# Patient Record
Sex: Female | Born: 1970
Health system: Southern US, Community
[De-identification: ages and names within clinical notes are randomized; demographics above are authoritative.]

## PROBLEM LIST (undated history)

## (undated) DIAGNOSIS — T7840XA Allergy, unspecified, initial encounter: Secondary | ICD-10-CM

## (undated) DIAGNOSIS — M1990 Unspecified osteoarthritis, unspecified site: Secondary | ICD-10-CM

## (undated) DIAGNOSIS — J45909 Unspecified asthma, uncomplicated: Secondary | ICD-10-CM

## (undated) HISTORY — DX: Allergy, unspecified, initial encounter: T78.40XA

## (undated) HISTORY — DX: Unspecified asthma, uncomplicated: J45.909

## (undated) HISTORY — DX: Unspecified osteoarthritis, unspecified site: M19.90

---
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
## 2003-07-03 ENCOUNTER — Other Ambulatory Visit: Admission: RE | Admit: 2003-07-03 | Discharge: 2003-07-03 | Payer: Self-pay | Admitting: Obstetrics & Gynecology

## 2005-02-03 ENCOUNTER — Other Ambulatory Visit: Admission: RE | Admit: 2005-02-03 | Discharge: 2005-02-03 | Payer: Self-pay | Admitting: Obstetrics and Gynecology

## 2005-05-09 ENCOUNTER — Encounter: Admission: RE | Admit: 2005-05-09 | Discharge: 2005-05-09 | Payer: Self-pay | Admitting: Obstetrics and Gynecology

## 2005-12-04 ENCOUNTER — Encounter: Admission: RE | Admit: 2005-12-04 | Discharge: 2005-12-04 | Payer: Self-pay | Admitting: Obstetrics and Gynecology

## 2007-07-14 LAB — CONVERTED CEMR LAB: Pap Smear: NORMAL

## 2009-03-12 ENCOUNTER — Ambulatory Visit: Payer: Self-pay | Admitting: Internal Medicine

## 2009-03-12 DIAGNOSIS — Z9189 Other specified personal risk factors, not elsewhere classified: Secondary | ICD-10-CM | POA: Insufficient documentation

## 2009-03-12 DIAGNOSIS — Z9889 Other specified postprocedural states: Secondary | ICD-10-CM | POA: Insufficient documentation

## 2009-03-12 DIAGNOSIS — J45909 Unspecified asthma, uncomplicated: Secondary | ICD-10-CM

## 2009-03-18 ENCOUNTER — Ambulatory Visit: Payer: Self-pay | Admitting: Internal Medicine

## 2009-03-19 LAB — CONVERTED CEMR LAB
ALT: 14 units/L (ref 0–35)
AST: 18 units/L (ref 0–37)
Albumin: 4.2 g/dL (ref 3.5–5.2)
Alkaline Phosphatase: 35 units/L — ABNORMAL LOW (ref 39–117)
BUN: 14 mg/dL (ref 6–23)
Basophils Absolute: 0.1 10*3/uL (ref 0.0–0.1)
Basophils Relative: 1 % (ref 0.0–3.0)
Bilirubin Urine: NEGATIVE
Bilirubin, Direct: 0.3 mg/dL (ref 0.0–0.3)
CO2: 25 meq/L (ref 19–32)
Calcium: 9.4 mg/dL (ref 8.4–10.5)
Chloride: 107 meq/L (ref 96–112)
Cholesterol: 191 mg/dL (ref 0–200)
Creatinine, Ser: 0.9 mg/dL (ref 0.4–1.2)
Eosinophils Absolute: 0.3 10*3/uL (ref 0.0–0.7)
Eosinophils Relative: 4 % (ref 0.0–5.0)
GFR calc non Af Amer: 73.89 mL/min (ref >60)
Glucose, Bld: 91 mg/dL (ref 70–99)
HCT: 43.7 % (ref 36.0–46.0)
HDL: 46.3 mg/dL (ref >39.00)
Hemoglobin, Urine: NEGATIVE
Hemoglobin: 14.8 g/dL (ref 12.0–15.0)
Ketones, ur: NEGATIVE mg/dL
LDL Cholesterol: 134 mg/dL — ABNORMAL HIGH (ref 0–99)
Leukocytes, UA: NEGATIVE
Lymphocytes Relative: 27.8 % (ref 12.0–46.0)
Lymphs Abs: 1.8 10*3/uL (ref 0.7–4.0)
MCHC: 33.9 g/dL (ref 30.0–36.0)
MCV: 95.3 fL (ref 78.0–100.0)
Monocytes Absolute: 0.5 10*3/uL (ref 0.1–1.0)
Monocytes Relative: 7.7 % (ref 3.0–12.0)
Neutro Abs: 3.6 10*3/uL (ref 1.4–7.7)
Neutrophils Relative %: 59.5 % (ref 43.0–77.0)
Nitrite: NEGATIVE
Platelets: 269 10*3/uL (ref 150.0–400.0)
Potassium: 4.7 meq/L (ref 3.5–5.1)
RBC: 4.58 M/uL (ref 3.87–5.11)
RDW: 12.3 % (ref 11.5–14.6)
Sodium: 137 meq/L (ref 135–145)
Specific Gravity, Urine: 1.02 (ref 1.000–1.030)
TSH: 1.92 microintl units/mL (ref 0.35–5.50)
Total Bilirubin: 2.3 mg/dL — ABNORMAL HIGH (ref 0.3–1.2)
Total CHOL/HDL Ratio: 4
Total Protein, Urine: NEGATIVE mg/dL
Total Protein: 7.6 g/dL (ref 6.0–8.3)
Triglycerides: 54 mg/dL (ref 0.0–149.0)
Urine Glucose: NEGATIVE mg/dL
Urobilinogen, UA: 0.2 (ref 0.0–1.0)
VLDL: 10.8 mg/dL (ref 0.0–40.0)
WBC: 6.3 10*3/uL (ref 4.5–10.5)
pH: 5.5 (ref 5.0–8.0)

## 2010-05-13 ENCOUNTER — Telehealth: Payer: Self-pay | Admitting: Internal Medicine

## 2010-07-02 ENCOUNTER — Encounter: Payer: Self-pay | Admitting: Obstetrics and Gynecology

## 2010-07-12 NOTE — Progress Notes (Signed)
Summary: cough symptoms  Phone Note Call from Patient   Caller: Patient Call For: Newt Lukes MD Reason for Call: Acute Illness Summary of Call: dry cough x 3 weeks, no fever, SOB, wheeze or sputum - dayquil  and tussionex ineffective at resolution of symptoms  - rec H2B, mucinex and tessalon two times a day x 10days, then each as needed - pt agrees to call if symptoms worse or new fever or probs with med Initial call taken by: Newt Lukes MD,  May 13, 2010 10:28 AM    New/Updated Medications: PEPCID 20 MG TABS (FAMOTIDINE) 1 by mouth two times a day MUCINEX DM 30-600 MG XR12H-TAB (DEXTROMETHORPHAN-GUAIFENESIN) 1 by mouth two times a day TESSALON PERLES 100 MG CAPS (BENZONATATE) 1-2 by mouth two times a day x 10days, then as needed for cough symptoms Prescriptions: TESSALON PERLES 100 MG CAPS (BENZONATATE) 1-2 by mouth two times a day x 10days, then as needed for cough symptoms  #60 x 0   Entered and Authorized by:   Newt Lukes MD   Signed by:   Newt Lukes MD on 05/13/2010   Method used:   Electronically to        [facility] # [phone]* (retail)       [address]       [place], [place]  [address]       Ph: [phone]       Fax: [phone]   RxID:   [id number]

## 2011-10-11 HISTORY — PX: KNEE ARTHROSCOPY W/ ACL RECONSTRUCTION: SHX1858

## 2012-02-29 ENCOUNTER — Other Ambulatory Visit: Payer: Self-pay | Admitting: Internal Medicine

## 2012-02-29 DIAGNOSIS — Z0000 Encounter for general adult medical examination without abnormal findings: Secondary | ICD-10-CM

## 2012-03-01 ENCOUNTER — Other Ambulatory Visit (INDEPENDENT_AMBULATORY_CARE_PROVIDER_SITE_OTHER): Payer: Self-pay

## 2012-03-01 DIAGNOSIS — Z0000 Encounter for general adult medical examination without abnormal findings: Secondary | ICD-10-CM

## 2012-03-01 LAB — URINALYSIS, ROUTINE W REFLEX MICROSCOPIC
Bilirubin Urine: NEGATIVE
Nitrite: NEGATIVE
Specific Gravity, Urine: 1.025 (ref 1.000–1.030)
Total Protein, Urine: NEGATIVE
Urine Glucose: NEGATIVE
Urobilinogen, UA: 0.2 (ref 0.0–1.0)
pH: 5.5 (ref 5.0–8.0)

## 2012-03-01 LAB — CBC WITH DIFFERENTIAL/PLATELET
Basophils Relative: 1.1 % (ref 0.0–3.0)
Eosinophils Absolute: 0.3 10*3/uL (ref 0.0–0.7)
Eosinophils Relative: 5 % (ref 0.0–5.0)
HCT: 40.1 % (ref 36.0–46.0)
Hemoglobin: 13.6 g/dL (ref 12.0–15.0)
Lymphocytes Relative: 31.4 % (ref 12.0–46.0)
Lymphs Abs: 1.9 10*3/uL (ref 0.7–4.0)
MCHC: 33.8 g/dL (ref 30.0–36.0)
MCV: 94.5 fl (ref 78.0–100.0)
Monocytes Absolute: 0.5 10*3/uL (ref 0.1–1.0)
Monocytes Relative: 8.2 % (ref 3.0–12.0)
Neutro Abs: 3.2 10*3/uL (ref 1.4–7.7)
Neutrophils Relative %: 54.3 % (ref 43.0–77.0)
Platelets: 241 10*3/uL (ref 150.0–400.0)
RBC: 4.24 Mil/uL (ref 3.87–5.11)
RDW: 13.6 % (ref 11.5–14.6)
WBC: 6 10*3/uL (ref 4.5–10.5)

## 2012-03-01 LAB — HEPATIC FUNCTION PANEL
ALT: 17 U/L (ref 0–35)
Albumin: 3.9 g/dL (ref 3.5–5.2)
Bilirubin, Direct: 0.2 mg/dL (ref 0.0–0.3)
Total Bilirubin: 1.9 mg/dL — ABNORMAL HIGH (ref 0.3–1.2)
Total Protein: 6.7 g/dL (ref 6.0–8.3)

## 2012-03-01 LAB — BASIC METABOLIC PANEL
Calcium: 9.1 mg/dL (ref 8.4–10.5)
Chloride: 108 mEq/L (ref 96–112)
Creatinine, Ser: 1 mg/dL (ref 0.4–1.2)
GFR: 68.41 mL/min (ref >60.00)
Potassium: 4.8 mEq/L (ref 3.5–5.1)
Sodium: 140 mEq/L (ref 135–145)

## 2012-03-01 LAB — LIPID PANEL
Cholesterol: 161 mg/dL (ref 0–200)
HDL: 45.3 mg/dL (ref >39.00)
LDL Cholesterol: 104 mg/dL — ABNORMAL HIGH (ref 0–99)
Total CHOL/HDL Ratio: 4
Triglycerides: 58 mg/dL (ref 0.0–149.0)
VLDL: 11.6 mg/dL (ref 0.0–40.0)

## 2012-03-01 LAB — TSH: TSH: 1.82 u[IU]/mL (ref 0.35–5.50)

## 2012-03-06 ENCOUNTER — Ambulatory Visit (INDEPENDENT_AMBULATORY_CARE_PROVIDER_SITE_OTHER): Payer: 59 | Admitting: Internal Medicine

## 2012-03-06 ENCOUNTER — Encounter: Payer: Self-pay | Admitting: Internal Medicine

## 2012-03-06 VITALS — BP 110/80 | HR 70 | Temp 97.0°F | Resp 16 | Wt 164.0 lb

## 2012-03-06 DIAGNOSIS — Z0000 Encounter for general adult medical examination without abnormal findings: Secondary | ICD-10-CM

## 2012-03-06 DIAGNOSIS — Z1239 Encounter for other screening for malignant neoplasm of breast: Secondary | ICD-10-CM

## 2012-03-06 NOTE — Patient Instructions (Signed)
It was good to see you today. We have reviewed your prior records including labs and tests today Health Maintenance reviewed - all recommended immunizations and age-appropriate screenings are up-to-date. we'll make referral to mammogram . Our office will contact you regarding appointment(s) once made. Please schedule followup in 1-2 years, call sooner if problems.  Health Maintenance, Females A healthy lifestyle and preventative care can promote health and wellness.  Maintain regular health, dental, and eye exams.   Eat a healthy diet. Foods like vegetables, fruits, whole grains, low-fat dairy products, and lean protein foods contain the nutrients you need without too many calories. Decrease your intake of foods high in solid fats, added sugars, and salt. Get information about a proper diet from your caregiver, if necessary.   Regular physical exercise is one of the most important things you can do for your health. Most adults should get at least 150 minutes of moderate-intensity exercise (any activity that increases your heart rate and causes you to sweat) each week. In addition, most adults need muscle-strengthening exercises on 2 or more days a week.     Maintain a healthy weight. The body mass index (BMI) is a screening tool to identify possible weight problems. It provides an estimate of body fat based on height and weight. Your caregiver can help determine your BMI, and can help you achieve or maintain a healthy weight. For adults 20 years and older:   A BMI below 18.5 is considered underweight.   A BMI of 18.5 to 24.9 is normal.   A BMI of 25 to 29.9 is considered overweight.   A BMI of 30 and above is considered obese.   Maintain normal blood lipids and cholesterol by exercising and minimizing your intake of saturated fat. Eat a balanced diet with plenty of fruits and vegetables. Blood tests for lipids and cholesterol should begin at age 32 and be repeated every 5 years. If your  lipid or cholesterol levels are high, you are over 50, or you are a high risk for heart disease, you may need your cholesterol levels checked more frequently. Ongoing high lipid and cholesterol levels should be treated with medicines if diet and exercise are not effective.   If you smoke, find out from your caregiver how to quit. If you do not use tobacco, do not start.   If you are pregnant, do not drink alcohol. If you are breastfeeding, be very cautious about drinking alcohol. If you are not pregnant and choose to drink alcohol, do not exceed 1 drink per day. One drink is considered to be 12 ounces (355 mL) of beer, 5 ounces (148 mL) of wine, or 1.5 ounces (44 mL) of liquor.   Avoid use of street drugs. Do not share needles with anyone. Ask for help if you need support or instructions about stopping the use of drugs.   High blood pressure causes heart disease and increases the risk of stroke. Blood pressure should be checked at least every 1 to 2 years. Ongoing high blood pressure should be treated with medicines, if weight loss and exercise are not effective.   If you are 28 to 42 years old, ask your caregiver if you should take aspirin to prevent strokes.   Diabetes screening involves taking a blood sample to check your fasting blood sugar level. This should be done once every 3 years, after age 38, if you are within normal weight and without risk factors for diabetes. Testing should be considered at a younger  age or be carried out more frequently if you are overweight and have at least 1 risk factor for diabetes.   Breast cancer screening is essential preventative care for women. You should practice "breast self-awareness." This means understanding the normal appearance and feel of your breasts and may include breast self-examination. Any changes detected, no matter how small, should be reported to a caregiver. Women in their 19s and 30s should have a clinical breast exam (CBE) by a caregiver as  part of a regular health exam every 1 to 3 years. After age 43, women should have a CBE every year. Starting at age 108, women should consider having a mammogram (breast X-ray) every year. Women who have a family history of breast cancer should talk to their caregiver about genetic screening. Women at a high risk of breast cancer should talk to their caregiver about having an MRI and a mammogram every year.   The Pap test is a screening test for cervical cancer. Women should have a Pap test starting at age 35. Between ages 89 and 96, Pap tests should be repeated every 2 years. Beginning at age 7, you should have a Pap test every 3 years as long as the past 3 Pap tests have been normal. If you had a hysterectomy for a problem that was not cancer or a condition that could lead to cancer, then you no longer need Pap tests. If you are between ages 11 and 72, and you have had normal Pap tests going back 10 years, you no longer need Pap tests. If you have had past treatment for cervical cancer or a condition that could lead to cancer, you need Pap tests and screening for cancer for at least 20 years after your treatment. If Pap tests have been discontinued, risk factors (such as a new sexual partner) need to be reassessed to determine if screening should be resumed. Some women have medical problems that increase the chance of getting cervical cancer. In these cases, your caregiver may recommend more frequent screening and Pap tests.   The human papillomavirus (HPV) test is an additional test that may be used for cervical cancer screening. The HPV test looks for the virus that can cause the cell changes on the cervix. The cells collected during the Pap test can be tested for HPV. The HPV test could be used to screen women aged 61 years and older, and should be used in women of any age who have unclear Pap test results. After the age of 75, women should have HPV testing at the same frequency as a Pap test.    Colorectal cancer can be detected and often prevented. Most routine colorectal cancer screening begins at the age of 61 and continues through age 45. However, your caregiver may recommend screening at an earlier age if you have risk factors for colon cancer. On a yearly basis, your caregiver may provide home test kits to check for hidden blood in the stool. Use of a small camera at the end of a tube, to directly examine the colon (sigmoidoscopy or colonoscopy), can detect the earliest forms of colorectal cancer. Talk to your caregiver about this at age 38, when routine screening begins. Direct examination of the colon should be repeated every 5 to 10 years through age 95, unless early forms of pre-cancerous polyps or small growths are found.   Hepatitis C blood testing is recommended for all people born from [birth year] through 1966 and any individual with known risks for  hepatitis C.   Practice safe sex. Use condoms and avoid high-risk sexual practices to reduce the spread of sexually transmitted infections (STIs). Sexually active women aged 36 and younger should be checked for Chlamydia, which is a common sexually transmitted infection. Older women with new or multiple partners should also be tested for Chlamydia. Testing for other STIs is recommended if you are sexually active and at increased risk.   Osteoporosis is a disease in which the bones lose minerals and strength with aging. This can result in serious bone fractures. The risk of osteoporosis can be identified using a bone density scan. Women ages 85 and over and women at risk for fractures or osteoporosis should discuss screening with their caregivers. Ask your caregiver whether you should be taking a calcium supplement or vitamin D to reduce the rate of osteoporosis.   Menopause can be associated with physical symptoms and risks. Hormone replacement therapy is available to decrease symptoms and risks. You should talk to your caregiver about whether  hormone replacement therapy is right for you.   Use sunscreen with a sun protection factor (SPF) of 30 or greater. Apply sunscreen liberally and repeatedly throughout the day. You should seek shade when your shadow is shorter than you. Protect yourself by wearing long sleeves, pants, a wide-brimmed hat, and sunglasses year round, whenever you are outdoors.   Notify your caregiver of new moles or changes in moles, especially if there is a change in shape or color. Also notify your caregiver if a mole is larger than the size of a pencil eraser.   Stay current with your immunizations.  Document Released: 12/12/2010 Document Revised: 05/18/2011 Document Reviewed: 12/12/2010 [facility] Patient Information 2012 [place], [place].

## 2012-03-06 NOTE — Progress Notes (Signed)
  Subjective:    Patient ID: Kristina Booth, female    DOB: Dec 26, 1970, 41 y.o.   MRN: [id number]  HPI patient is here today for annual physical. Patient feels well and has no complaints.  Past Medical History  Diagnosis Date  . Childhood asthma    Family History  Problem Relation Age of Onset  . Bipolar disorder Father   . Hypertension Mother   . Diabetes type II Father   . Vasculitis Sister 29    NOS   History  Substance Use Topics  . Smoking status: Never Smoker   . Smokeless tobacco: Not on file   Comment: Lives with domestic partner and 2 children  . Alcohol Use: Yes    Review of Systems Constitutional: Negative for fever or weight change.  Respiratory: Negative for cough and shortness of breath.   Cardiovascular: Negative for chest pain or palpitations.  Gastrointestinal: Negative for abdominal pain, no bowel changes.  Musculoskeletal: Negative for gait problem or joint swelling.  Skin: Negative for rash.  Neurological: Negative for dizziness or headache.  No other specific complaints in a complete review of systems (except as listed in HPI above).     Objective:   Physical Exam BP 110/80  Pulse 70  Temp 97 F (36.1 C) (Oral)  Resp 16  Wt 164 lb (74.39 kg)  SpO2 97%  LMP 02/02/2012 Wt Readings from Last 3 Encounters:  03/06/12 164 lb (74.39 kg)  03/12/09 168 lb (76.204 kg)   Constitutional: She appears well-developed and well-nourished. No distress.  HENT: Head: Normocephalic and atraumatic. Ears: B TMs ok, no erythema or effusion; Nose: Nose normal.  Mouth/Throat: Oropharynx is clear and moist. No oropharyngeal exudate.  Eyes: Conjunctivae and EOM are normal. Pupils are equal, round, and reactive to light. No scleral icterus.  Neck: Normal range of motion. Neck supple. No JVD present. No thyromegaly present.  Cardiovascular: Normal rate, regular rhythm and normal heart sounds.  No murmur heard. No BLE edema. Pulmonary/Chest: Effort normal and breath  sounds normal. No respiratory distress. She has no wheezes.  Abdominal: Soft. Bowel sounds are normal. She exhibits no distension. There is no tenderness. no masses Musculoskeletal: Normal range of motion, no joint effusions. No gross deformities Neurological: She is alert and oriented to person, place, and time. No cranial nerve deficit. Coordination normal.  Skin: Skin is warm and dry. No rash noted. No erythema.  Psychiatric: She has a normal mood and affect. Her behavior is normal. Judgment and thought content normal.   Lab Results  Component Value Date   WBC 6.0 03/01/2012   HGB 13.6 03/01/2012   HCT 40.1 03/01/2012   PLT 241.0 03/01/2012   GLUCOSE 91 03/01/2012   CHOL 161 03/01/2012   TRIG 58.0 03/01/2012   HDL 45.30 03/01/2012   LDLCALC 104* 03/01/2012   ALT 17 03/01/2012   AST 20 03/01/2012   NA 140 03/01/2012   K 4.8 03/01/2012   CL 108 03/01/2012   CREATININE 1.0 03/01/2012   BUN 15 03/01/2012   CO2 26 03/01/2012   TSH 1.82 03/01/2012       Assessment & Plan:  CPX/v70.0 - Patient has been counseled on age-appropriate routine health concerns for screening and prevention. These are reviewed and up-to-date. Immunizations are up-to-date or declined. Labs and ECG reviewed.

## 2012-04-10 ENCOUNTER — Ambulatory Visit
Admission: RE | Admit: 2012-04-10 | Discharge: 2012-04-10 | Disposition: A | Discharge status: Home / Self Care | Payer: 59 | Source: Ambulatory Visit | Attending: Internal Medicine | Admitting: Internal Medicine

## 2012-04-10 DIAGNOSIS — Z1239 Encounter for other screening for malignant neoplasm of breast: Secondary | ICD-10-CM

## 2012-04-12 ENCOUNTER — Other Ambulatory Visit: Payer: Self-pay | Admitting: Internal Medicine

## 2012-04-12 DIAGNOSIS — R928 Other abnormal and inconclusive findings on diagnostic imaging of breast: Secondary | ICD-10-CM

## 2012-04-19 ENCOUNTER — Other Ambulatory Visit: Payer: Self-pay | Admitting: Internal Medicine

## 2012-04-19 ENCOUNTER — Ambulatory Visit
Admission: RE | Admit: 2012-04-19 | Discharge: 2012-04-19 | Disposition: A | Discharge status: Home / Self Care | Payer: 59 | Source: Ambulatory Visit | Attending: Internal Medicine | Admitting: Internal Medicine

## 2012-04-19 DIAGNOSIS — R928 Other abnormal and inconclusive findings on diagnostic imaging of breast: Secondary | ICD-10-CM

## 2012-05-03 ENCOUNTER — Other Ambulatory Visit: Payer: Self-pay | Admitting: Internal Medicine

## 2012-05-03 ENCOUNTER — Ambulatory Visit
Admission: RE | Admit: 2012-05-03 | Discharge: 2012-05-03 | Disposition: A | Discharge status: Home / Self Care | Payer: 59 | Source: Ambulatory Visit | Attending: Internal Medicine | Admitting: Internal Medicine

## 2012-05-03 DIAGNOSIS — R928 Other abnormal and inconclusive findings on diagnostic imaging of breast: Secondary | ICD-10-CM

## 2013-06-12 HISTORY — PX: BREAST BIOPSY: SHX20

## 2013-06-27 ENCOUNTER — Other Ambulatory Visit: Payer: Self-pay | Admitting: Obstetrics and Gynecology

## 2015-06-08 ENCOUNTER — Ambulatory Visit (INDEPENDENT_AMBULATORY_CARE_PROVIDER_SITE_OTHER): Payer: 59 | Admitting: Internal Medicine

## 2015-06-08 ENCOUNTER — Encounter: Payer: Self-pay | Admitting: Internal Medicine

## 2015-06-08 ENCOUNTER — Other Ambulatory Visit (INDEPENDENT_AMBULATORY_CARE_PROVIDER_SITE_OTHER): Payer: 59

## 2015-06-08 VITALS — BP 108/78 | HR 70 | Temp 98.0°F | Resp 16 | Ht 68.0 in | Wt 174.0 lb

## 2015-06-08 DIAGNOSIS — Z0000 Encounter for general adult medical examination without abnormal findings: Secondary | ICD-10-CM

## 2015-06-08 LAB — CBC WITH DIFFERENTIAL/PLATELET
BASOS PCT: 1.2 % (ref 0.0–3.0)
Basophils Absolute: 0.1 10*3/uL (ref 0.0–0.1)
Eosinophils Absolute: 0.3 10*3/uL (ref 0.0–0.7)
Eosinophils Relative: 3.9 % (ref 0.0–5.0)
HCT: 43.6 % (ref 36.0–46.0)
Hemoglobin: 14.7 g/dL (ref 12.0–15.0)
LYMPHS ABS: 1.9 10*3/uL (ref 0.7–4.0)
Lymphocytes Relative: 26.4 % (ref 12.0–46.0)
MCHC: 33.7 g/dL (ref 30.0–36.0)
MCV: 93.4 fl (ref 78.0–100.0)
MONO ABS: 0.5 10*3/uL (ref 0.1–1.0)
Monocytes Relative: 7.5 % (ref 3.0–12.0)
NEUTROS PCT: 61 % (ref 43.0–77.0)
Neutro Abs: 4.5 10*3/uL (ref 1.4–7.7)
Platelets: 320 10*3/uL (ref 150.0–400.0)
RBC: 4.66 Mil/uL (ref 3.87–5.11)
RDW: 13.8 % (ref 11.5–15.5)
WBC: 7.3 10*3/uL (ref 4.0–10.5)

## 2015-06-08 LAB — COMPREHENSIVE METABOLIC PANEL
ALT: 12 U/L (ref 0–35)
AST: 16 U/L (ref 0–37)
Albumin: 4.3 g/dL (ref 3.5–5.2)
Alkaline Phosphatase: 41 U/L (ref 39–117)
BUN: 12 mg/dL (ref 6–23)
CO2: 29 mEq/L (ref 19–32)
Calcium: 9.1 mg/dL (ref 8.4–10.5)
Chloride: 102 mEq/L (ref 96–112)
Creatinine, Ser: 0.85 mg/dL (ref 0.40–1.20)
GFR: 76.62 mL/min (ref >60.00)
Glucose, Bld: 90 mg/dL (ref 70–99)
POTASSIUM: 4.3 meq/L (ref 3.5–5.1)
SODIUM: 138 meq/L (ref 135–145)
Total Bilirubin: 1.1 mg/dL (ref 0.2–1.2)
Total Protein: 7.3 g/dL (ref 6.0–8.3)

## 2015-06-08 LAB — LIPID PANEL
CHOLESTEROL: 209 mg/dL — AB (ref 0–200)
HDL: 63.1 mg/dL (ref >39.00)
LDL Cholesterol: 136 mg/dL — ABNORMAL HIGH (ref 0–99)
NonHDL: 146.23
Total CHOL/HDL Ratio: 3
Triglycerides: 49 mg/dL (ref 0.0–149.0)
VLDL: 9.8 mg/dL (ref 0.0–40.0)

## 2015-06-08 LAB — TSH: TSH: 1.87 u[IU]/mL (ref 0.35–4.50)

## 2015-06-08 NOTE — Progress Notes (Signed)
Pre visit review using our clinic review tool, if applicable. No additional management support is needed unless otherwise documented below in the visit note. 

## 2015-06-08 NOTE — Progress Notes (Signed)
Subjective:    Patient ID: Kristina Booth Whichard, female    DOB: Jun 25, 1970, 44 y.o.   MRN: [id number]  HPI She is here for a physical exam.   She has no concerns or questions.  She is a PA.    Medications and allergies reviewed with patient and updated if appropriate.  Patient Active Problem List   Diagnosis Date Noted  . ASTHMA 03/12/2009  . CHICKENPOX, HX OF 03/12/2009    No current outpatient prescriptions on file prior to visit.   No current facility-administered medications on file prior to visit.    Past Medical History  Diagnosis Date  . Childhood asthma   . Arthritis     Past Surgical History  Procedure Laterality Date  . Knee arthroscopy w/ acl reconstruction  10/2011    L knee - [facility]  . Umbilical hernia repair  1973    Social History   Social History  . Marital Status: Married    Spouse Name: N/A  . Number of Children: N/A  . Years of Education: N/A   Social History Main Topics  . Smoking status: Never Smoker   . Smokeless tobacco: None     Comment: Lives with domestic partner and 2 children  . Alcohol Use: Yes  . Drug Use: No  . Sexual Activity: Not Asked   Other Topics Concern  . None   Social History Narrative    Review of Systems  Constitutional: Negative for fever and chills.  HENT: Negative for hearing loss.   Eyes: Negative for visual disturbance.  Respiratory: Negative for cough, shortness of breath and wheezing.   Cardiovascular: Negative for chest pain, palpitations and leg swelling.  Gastrointestinal: Negative for nausea, abdominal pain, diarrhea, constipation and blood in stool.       No GERD  Genitourinary: Negative for dysuria and difficulty urinating.  Musculoskeletal: Positive for back pain.  Skin: Negative for rash.  Neurological: Negative for dizziness, light-headedness and headaches.  Psychiatric/Behavioral: Negative for dysphoric mood. The patient is not nervous/anxious.        Objective:   Filed Vitals:   06/08/15 0920  BP: 108/78  Pulse: 70  Temp: 98 F (36.7 C)  Resp: 16   Filed Weights   06/08/15 0920  Weight: 174 lb (78.926 kg)   Body mass index is 26.46 kg/(m^2).   Physical Exam Constitutional: She appears well-developed and well-nourished. No distress.  HENT:  Head: Normocephalic and atraumatic.  Right Ear: External ear normal.  Left Ear: External ear normal.  Mouth/Throat: Oropharynx is clear and moist.  Normal bilateral ear canals and tympanic membranes  Eyes: Conjunctivae and EOM are normal.  Neck: Neck supple. No tracheal deviation present. No thyromegaly present.  No carotid bruit  Cardiovascular: Normal rate, regular rhythm and normal heart sounds.   No murmur heard. Pulmonary/Chest: Effort normal and breath sounds normal. No respiratory distress. She has no wheezes. She has no rales.  Abdominal: Soft. She exhibits no distension. There is no tenderness.  Musculoskeletal: She exhibits no edema.  Lymphadenopathy:    She has no cervical adenopathy.  Skin: Skin is warm and dry. She is not diaphoretic.  Psychiatric: She has a normal mood and affect. Her behavior is normal.         Assessment & Plan:   Physical exam: Screening blood  ordered Immunizations up to date Mammogram up to date Gyn - she will schedule Exercise - not regular now - stressed regular exercise Weight - will work on  weight loss Skin - she monitors it and uses sun screen Substance abuse - none    Follow up every 1-2 years for a PE

## 2015-06-08 NOTE — Patient Instructions (Signed)
We have reviewed your prior records including labs and tests today.  Test(s) ordered today. Your results will be released to MyChart (or called to you) after review, usually within 72hours after test completion. If any changes need to be made, you will be notified at that same time.  All other Health Maintenance issues reviewed.   All recommended immunizations and age-appropriate screenings are up-to-date.  No immunizations administered today.      Health Maintenance, Female Adopting a healthy lifestyle and getting preventive care can go a long way to promote health and wellness. Talk with your health care provider about what schedule of regular examinations is right for you. This is a good chance for you to check in with your provider about disease prevention and staying healthy. In between checkups, there are plenty of things you can do on your own. Experts have done a lot of research about which lifestyle changes and preventive measures are most likely to keep you healthy. Ask your health care provider for more information. WEIGHT AND DIET  Eat a healthy diet  Be sure to include plenty of vegetables, fruits, low-fat dairy products, and lean protein.  Do not eat a lot of foods high in solid fats, added sugars, or salt.  Get regular exercise. This is one of the most important things you can do for your health.  Most adults should exercise for at least 150 minutes each week. The exercise should increase your heart rate and make you sweat (moderate-intensity exercise).  Most adults should also do strengthening exercises at least twice a week. This is in addition to the moderate-intensity exercise.  Maintain a healthy weight  Body mass index (BMI) is a measurement that can be used to identify possible weight problems. It estimates body fat based on height and weight. Your health care provider can help determine your BMI and help you achieve or maintain a healthy weight.  For females 65  years of age and older:   A BMI below 18.5 is considered underweight.  A BMI of 18.5 to 24.9 is normal.  A BMI of 25 to 29.9 is considered overweight.  A BMI of 30 and above is considered obese.  Watch levels of cholesterol and blood lipids  You should start having your blood tested for lipids and cholesterol at 45 years of age, then have this test every 5 years.  You may need to have your cholesterol levels checked more often if:  Your lipid or cholesterol levels are high.  You are older than 45 years of age.  You are at high risk for heart disease.  CANCER SCREENING   Lung Cancer  Lung cancer screening is recommended for adults 91-2 years old who are at high risk for lung cancer because of a history of smoking.  A yearly low-dose CT scan of the lungs is recommended for people who:  Currently smoke.  Have quit within the past 15 years.  Have at least a 30-pack-year history of smoking. A pack year is smoking an average of one pack of cigarettes a day for 1 year.  Yearly screening should continue until it has been 15 years since you quit.  Yearly screening should stop if you develop a health problem that would prevent you from having lung cancer treatment.  Breast Cancer  Practice breast self-awareness. This means understanding how your breasts normally appear and feel.  It also means doing regular breast self-exams. Let your health care provider know about any changes, no matter how  small.  If you are in your 20s or 30s, you should have a clinical breast exam (CBE) by a health care provider every 1-3 years as part of a regular health exam.  If you are 40 or older, have a CBE every year. Also consider having a breast X-ray (mammogram) every year.  If you have a family history of breast cancer, talk to your health care provider about genetic screening.  If you are at high risk for breast cancer, talk to your health care provider about having an MRI and a mammogram  every year.  Breast cancer gene (BRCA) assessment is recommended for women who have family members with BRCA-related cancers. BRCA-related cancers include:  Breast.  Ovarian.  Tubal.  Peritoneal cancers.  Results of the assessment will determine the need for genetic counseling and BRCA1 and BRCA2 testing. Cervical Cancer Your health care provider may recommend that you be screened regularly for cancer of the pelvic organs (ovaries, uterus, and vagina). This screening involves a pelvic examination, including checking for microscopic changes to the surface of your cervix (Pap test). You may be encouraged to have this screening done every 3 years, beginning at age 88.  For women ages 48-65, health care providers may recommend pelvic exams and Pap testing every 3 years, or they may recommend the Pap and pelvic exam, combined with testing for human papilloma virus (HPV), every 5 years. Some types of HPV increase your risk of cervical cancer. Testing for HPV may also be done on women of any age with unclear Pap test results.  Other health care providers may not recommend any screening for nonpregnant women who are considered low risk for pelvic cancer and who do not have symptoms. Ask your health care provider if a screening pelvic exam is right for you.  If you have had past treatment for cervical cancer or a condition that could lead to cancer, you need Pap tests and screening for cancer for at least 20 years after your treatment. If Pap tests have been discontinued, your risk factors (such as having a new sexual partner) need to be reassessed to determine if screening should resume. Some women have medical problems that increase the chance of getting cervical cancer. In these cases, your health care provider may recommend more frequent screening and Pap tests. Colorectal Cancer  This type of cancer can be detected and often prevented.  Routine colorectal cancer screening usually begins at 45  years of age and continues through 45 years of age.  Your health care provider may recommend screening at an earlier age if you have risk factors for colon cancer.  Your health care provider may also recommend using home test kits to check for hidden blood in the stool.  A small camera at the end of a tube can be used to examine your colon directly (sigmoidoscopy or colonoscopy). This is done to check for the earliest forms of colorectal cancer.  Routine screening usually begins at age 52.  Direct examination of the colon should be repeated every 5-10 years through 45 years of age. However, you may need to be screened more often if early forms of precancerous polyps or small growths are found. Skin Cancer  Check your skin from head to toe regularly.  Tell your health care provider about any new moles or changes in moles, especially if there is a change in a mole's shape or color.  Also tell your health care provider if you have a mole that is larger  than the size of a pencil eraser.  Always use sunscreen. Apply sunscreen liberally and repeatedly throughout the day.  Protect yourself by wearing long sleeves, pants, a wide-brimmed hat, and sunglasses whenever you are outside. HEART DISEASE, DIABETES, AND HIGH BLOOD PRESSURE   High blood pressure causes heart disease and increases the risk of stroke. High blood pressure is more likely to develop in:  People who have blood pressure in the high end of the normal range (130-139/85-89 mm Hg).  People who are overweight or obese.  People who are African American.  If you are 106-32 years of age, have your blood pressure checked every 3-5 years. If you are 86 years of age or older, have your blood pressure checked every year. You should have your blood pressure measured twice--once when you are at a hospital or clinic, and once when you are not at a hospital or clinic. Record the average of the two measurements. To check your blood pressure  when you are not at a hospital or clinic, you can use:  An automated blood pressure machine at a pharmacy.  A home blood pressure monitor.  If you are between 56 years and 76 years old, ask your health care provider if you should take aspirin to prevent strokes.  Have regular diabetes screenings. This involves taking a blood sample to check your fasting blood sugar level.  If you are at a normal weight and have a low risk for diabetes, have this test once every three years after 45 years of age.  If you are overweight and have a high risk for diabetes, consider being tested at a younger age or more often. PREVENTING INFECTION  Hepatitis B  If you have a higher risk for hepatitis B, you should be screened for this virus. You are considered at high risk for hepatitis B if:  You were born in a country where hepatitis B is common. Ask your health care provider which countries are considered high risk.  Your parents were born in a high-risk country, and you have not been immunized against hepatitis B (hepatitis B vaccine).  You have HIV or AIDS.  You use needles to inject street drugs.  You live with someone who has hepatitis B.  You have had sex with someone who has hepatitis B.  You get hemodialysis treatment.  You take certain medicines for conditions, including cancer, organ transplantation, and autoimmune conditions. Hepatitis C  Blood testing is recommended for:  Everyone born from [birth year] through 1966.  Anyone with known risk factors for hepatitis C. Sexually transmitted infections (STIs)  You should be screened for sexually transmitted infections (STIs) including gonorrhea and chlamydia if:  You are sexually active and are younger than 45 years of age.  You are older than 45 years of age and your health care provider tells you that you are at risk for this type of infection.  Your sexual activity has changed since you were last screened and you are at an increased risk  for chlamydia or gonorrhea. Ask your health care provider if you are at risk.  If you do not have HIV, but are at risk, it may be recommended that you take a prescription medicine daily to prevent HIV infection. This is called pre-exposure prophylaxis (PrEP). You are considered at risk if:  You are sexually active and do not regularly use condoms or know the HIV status of your partner(s).  You take drugs by injection.  You are sexually active with a  partner who has HIV. Talk with your health care provider about whether you are at high risk of being infected with HIV. If you choose to begin PrEP, you should first be tested for HIV. You should then be tested every 3 months for as long as you are taking PrEP.  PREGNANCY   If you are premenopausal and you may become pregnant, ask your health care provider about preconception counseling.  If you may become pregnant, take 400 to 800 micrograms (mcg) of folic acid every day.  If you want to prevent pregnancy, talk to your health care provider about birth control (contraception). OSTEOPOROSIS AND MENOPAUSE   Osteoporosis is a disease in which the bones lose minerals and strength with aging. This can result in serious bone fractures. Your risk for osteoporosis can be identified using a bone density scan.  If you are 55 years of age or older, or if you are at risk for osteoporosis and fractures, ask your health care provider if you should be screened.  Ask your health care provider whether you should take a calcium or vitamin D supplement to lower your risk for osteoporosis.  Menopause may have certain physical symptoms and risks.  Hormone replacement therapy may reduce some of these symptoms and risks. Talk to your health care provider about whether hormone replacement therapy is right for you.  HOME CARE INSTRUCTIONS   Schedule regular health, dental, and eye exams.  Stay current with your immunizations.   Do not use any tobacco products  including cigarettes, chewing tobacco, or electronic cigarettes.  If you are pregnant, do not drink alcohol.  If you are breastfeeding, limit how much and how often you drink alcohol.  Limit alcohol intake to no more than 1 drink per day for nonpregnant women. One drink equals 12 ounces of beer, 5 ounces of wine, or 1 ounces of hard liquor.  Do not use street drugs.  Do not share needles.  Ask your health care provider for help if you need support or information about quitting drugs.  Tell your health care provider if you often feel depressed.  Tell your health care provider if you have ever been abused or do not feel safe at home.   This information is not intended to replace advice given to you by your health care provider. Make sure you discuss any questions you have with your health care provider.   Document Released: 12/12/2010 Document Revised: 06/19/2014 Document Reviewed: 04/30/2013 [employer] Interactive Patient Education [employer].

## 2016-12-12 DIAGNOSIS — Z1231 Encounter for screening mammogram for malignant neoplasm of breast: Secondary | ICD-10-CM | POA: Diagnosis not present

## 2016-12-12 DIAGNOSIS — Z01419 Encounter for gynecological examination (general) (routine) without abnormal findings: Secondary | ICD-10-CM | POA: Diagnosis not present

## 2016-12-14 ENCOUNTER — Other Ambulatory Visit: Payer: Self-pay | Admitting: Obstetrics & Gynecology

## 2016-12-14 DIAGNOSIS — R928 Other abnormal and inconclusive findings on diagnostic imaging of breast: Secondary | ICD-10-CM

## 2016-12-29 ENCOUNTER — Ambulatory Visit
Admission: RE | Admit: 2016-12-29 | Discharge: 2016-12-29 | Disposition: A | Discharge status: Home / Self Care | Payer: 59 | Source: Ambulatory Visit | Attending: Obstetrics & Gynecology | Admitting: Obstetrics & Gynecology

## 2016-12-29 ENCOUNTER — Ambulatory Visit: Payer: 59

## 2016-12-29 ENCOUNTER — Other Ambulatory Visit: Payer: Self-pay | Admitting: Obstetrics & Gynecology

## 2016-12-29 DIAGNOSIS — R928 Other abnormal and inconclusive findings on diagnostic imaging of breast: Secondary | ICD-10-CM

## 2016-12-29 DIAGNOSIS — R922 Inconclusive mammogram: Secondary | ICD-10-CM | POA: Diagnosis not present

## 2016-12-29 DIAGNOSIS — N6489 Other specified disorders of breast: Secondary | ICD-10-CM | POA: Diagnosis not present

## 2017-01-21 NOTE — Patient Instructions (Addendum)
Test(s) ordered today. Your results will be released to [facility] (or called to you) after review, usually within 72hours after test completion. If any changes need to be made, you will be notified at that same time.  All other Health Maintenance issues reviewed.   All recommended immunizations and age-appropriate screenings are up-to-date or discussed.  No immunizations administered today.   Medications reviewed and updated.  No changes recommended at this time.  Your prescription(s) have been submitted to your pharmacy. Please take as directed and contact our office if you believe you are having problem(s) with the medication(s).   Please followup in one year   Health Maintenance, Female Adopting a healthy lifestyle and getting preventive care can go a long way to promote health and wellness. Talk with your health care provider about what schedule of regular examinations is right for you. This is a good chance for you to check in with your provider about disease prevention and staying healthy. In between checkups, there are plenty of things you can do on your own. Experts have done a lot of research about which lifestyle changes and preventive measures are most likely to keep you healthy. Ask your health care provider for more information. Weight and diet Eat a healthy diet  Be sure to include plenty of vegetables, fruits, low-fat dairy products, and lean protein.  Do not eat a lot of foods high in solid fats, added sugars, or salt.  Get regular exercise. This is one of the most important things you can do for your health. ? Most adults should exercise for at least 150 minutes each week. The exercise should increase your heart rate and make you sweat (moderate-intensity exercise). ? Most adults should also do strengthening exercises at least twice a week. This is in addition to the moderate-intensity exercise.  Maintain a healthy weight  Body mass index (BMI) is a measurement that can  be used to identify possible weight problems. It estimates body fat based on height and weight. Your health care provider can help determine your BMI and help you achieve or maintain a healthy weight.  For females 37 years of age and older: ? A BMI below 18.5 is considered underweight. ? A BMI of 18.5 to 24.9 is normal. ? A BMI of 25 to 29.9 is considered overweight. ? A BMI of 30 and above is considered obese.  Watch levels of cholesterol and blood lipids  You should start having your blood tested for lipids and cholesterol at 47 years of age, then have this test every 5 years.  You may need to have your cholesterol levels checked more often if: ? Your lipid or cholesterol levels are high. ? You are older than 47 years of age. ? You are at high risk for heart disease.  Cancer screening Lung Cancer  Lung cancer screening is recommended for adults 67-73 years old who are at high risk for lung cancer because of a history of smoking.  A yearly low-dose CT scan of the lungs is recommended for people who: ? Currently smoke. ? Have quit within the past 15 years. ? Have at least a 30-pack-year history of smoking. A pack year is smoking an average of one pack of cigarettes a day for 1 year.  Yearly screening should continue until it has been 15 years since you quit.  Yearly screening should stop if you develop a health problem that would prevent you from having lung cancer treatment.  Breast Cancer  Practice breast self-awareness.  This means understanding how your breasts normally appear and feel.  It also means doing regular breast self-exams. Let your health care provider know about any changes, no matter how small.  If you are in your 20s or 30s, you should have a clinical breast exam (CBE) by a health care provider every 1-3 years as part of a regular health exam.  If you are 29 or older, have a CBE every year. Also consider having a breast X-ray (mammogram) every year.  If you  have a family history of breast cancer, talk to your health care provider about genetic screening.  If you are at high risk for breast cancer, talk to your health care provider about having an MRI and a mammogram every year.  Breast cancer gene (BRCA) assessment is recommended for women who have family members with BRCA-related cancers. BRCA-related cancers include: ? Breast. ? Ovarian. ? Tubal. ? Peritoneal cancers.  Results of the assessment will determine the need for genetic counseling and BRCA1 and BRCA2 testing.  Cervical Cancer Your health care provider may recommend that you be screened regularly for cancer of the pelvic organs (ovaries, uterus, and vagina). This screening involves a pelvic examination, including checking for microscopic changes to the surface of your cervix (Pap test). You may be encouraged to have this screening done every 3 years, beginning at age 40.  For women ages 44-65, health care providers may recommend pelvic exams and Pap testing every 3 years, or they may recommend the Pap and pelvic exam, combined with testing for human papilloma virus (HPV), every 5 years. Some types of HPV increase your risk of cervical cancer. Testing for HPV may also be done on women of any age with unclear Pap test results.  Other health care providers may not recommend any screening for nonpregnant women who are considered low risk for pelvic cancer and who do not have symptoms. Ask your health care provider if a screening pelvic exam is right for you.  If you have had past treatment for cervical cancer or a condition that could lead to cancer, you need Pap tests and screening for cancer for at least 20 years after your treatment. If Pap tests have been discontinued, your risk factors (such as having a new sexual partner) need to be reassessed to determine if screening should resume. Some women have medical problems that increase the chance of getting cervical cancer. In these cases,  your health care provider may recommend more frequent screening and Pap tests.  Colorectal Cancer  This type of cancer can be detected and often prevented.  Routine colorectal cancer screening usually begins at 47 years of age and continues through 47 years of age.  Your health care provider may recommend screening at an earlier age if you have risk factors for colon cancer.  Your health care provider may also recommend using home test kits to check for hidden blood in the stool.  A small camera at the end of a tube can be used to examine your colon directly (sigmoidoscopy or colonoscopy). This is done to check for the earliest forms of colorectal cancer.  Routine screening usually begins at age 96.  Direct examination of the colon should be repeated every 5-10 years through 47 years of age. However, you may need to be screened more often if early forms of precancerous polyps or small growths are found.  Skin Cancer  Check your skin from head to toe regularly.  Tell your health care provider about any  new moles or changes in moles, especially if there is a change in a mole's shape or color.  Also tell your health care provider if you have a mole that is larger than the size of a pencil eraser.  Always use sunscreen. Apply sunscreen liberally and repeatedly throughout the day.  Protect yourself by wearing long sleeves, pants, a wide-brimmed hat, and sunglasses whenever you are outside.  Heart disease, diabetes, and high blood pressure  High blood pressure causes heart disease and increases the risk of stroke. High blood pressure is more likely to develop in: ? People who have blood pressure in the high end of the normal range (130-139/85-89 mm Hg). ? People who are overweight or obese. ? People who are African American.  If you are 58-1 years of age, have your blood pressure checked every 3-5 years. If you are 52 years of age or older, have your blood pressure checked every year.  You should have your blood pressure measured twice-once when you are at a hospital or clinic, and once when you are not at a hospital or clinic. Record the average of the two measurements. To check your blood pressure when you are not at a hospital or clinic, you can use: ? An automated blood pressure machine at a pharmacy. ? A home blood pressure monitor.  If you are between 24 years and 65 years old, ask your health care provider if you should take aspirin to prevent strokes.  Have regular diabetes screenings. This involves taking a blood sample to check your fasting blood sugar level. ? If you are at a normal weight and have a low risk for diabetes, have this test once every three years after 47 years of age. ? If you are overweight and have a high risk for diabetes, consider being tested at a younger age or more often. Preventing infection Hepatitis B  If you have a higher risk for hepatitis B, you should be screened for this virus. You are considered at high risk for hepatitis B if: ? You were born in a country where hepatitis B is common. Ask your health care provider which countries are considered high risk. ? Your parents were born in a high-risk country, and you have not been immunized against hepatitis B (hepatitis B vaccine). ? You have HIV or AIDS. ? You use needles to inject street drugs. ? You live with someone who has hepatitis B. ? You have had sex with someone who has hepatitis B. ? You get hemodialysis treatment. ? You take certain medicines for conditions, including cancer, organ transplantation, and autoimmune conditions.  Hepatitis C  Blood testing is recommended for: ? Everyone born from [birth year] through 1966. ? Anyone with known risk factors for hepatitis C.  Sexually transmitted infections (STIs)  You should be screened for sexually transmitted infections (STIs) including gonorrhea and chlamydia if: ? You are sexually active and are younger than 47 years of  age. ? You are older than 47 years of age and your health care provider tells you that you are at risk for this type of infection. ? Your sexual activity has changed since you were last screened and you are at an increased risk for chlamydia or gonorrhea. Ask your health care provider if you are at risk.  If you do not have HIV, but are at risk, it may be recommended that you take a prescription medicine daily to prevent HIV infection. This is called pre-exposure prophylaxis (PrEP). You are considered at  risk if: ? You are sexually active and do not regularly use condoms or know the HIV status of your partner(s). ? You take drugs by injection. ? You are sexually active with a partner who has HIV.  Talk with your health care provider about whether you are at high risk of being infected with HIV. If you choose to begin PrEP, you should first be tested for HIV. You should then be tested every 3 months for as long as you are taking PrEP. Pregnancy  If you are premenopausal and you may become pregnant, ask your health care provider about preconception counseling.  If you may become pregnant, take 400 to 800 micrograms (mcg) of folic acid every day.  If you want to prevent pregnancy, talk to your health care provider about birth control (contraception). Osteoporosis and menopause  Osteoporosis is a disease in which the bones lose minerals and strength with aging. This can result in serious bone fractures. Your risk for osteoporosis can be identified using a bone density scan.  If you are 6 years of age or older, or if you are at risk for osteoporosis and fractures, ask your health care provider if you should be screened.  Ask your health care provider whether you should take a calcium or vitamin D supplement to lower your risk for osteoporosis.  Menopause may have certain physical symptoms and risks.  Hormone replacement therapy may reduce some of these symptoms and risks. Talk to your health  care provider about whether hormone replacement therapy is right for you. Follow these instructions at home:  Schedule regular health, dental, and eye exams.  Stay current with your immunizations.  Do not use any tobacco products including cigarettes, chewing tobacco, or electronic cigarettes.  If you are pregnant, do not drink alcohol.  If you are breastfeeding, limit how much and how often you drink alcohol.  Limit alcohol intake to no more than 1 drink per day for nonpregnant women. One drink equals 12 ounces of beer, 5 ounces of wine, or 1 ounces of hard liquor.  Do not use street drugs.  Do not share needles.  Ask your health care provider for help if you need support or information about quitting drugs.  Tell your health care provider if you often feel depressed.  Tell your health care provider if you have ever been abused or do not feel safe at home. This information is not intended to replace advice given to you by your health care provider. Make sure you discuss any questions you have with your health care provider. Document Released: 12/12/2010 Document Revised: 11/04/2015 Document Reviewed: 03/02/2015 Elsevier Interactive Patient Education  [employer].

## 2017-01-21 NOTE — Progress Notes (Signed)
Subjective:    Patient ID: Kristina QuarryColleen P Rogoff, female    DOB: March 15, 1971, 46 y.o.   MRN: [id number]  HPI She is here for a physical exam.   She denies any changes in her health.  Her sister did pass away - she had lymphoma.    Her asthma has been slightly more active.  She was wondering if that is related to her being in peri-menopause.  She has needed to use her albuterol more frequently, but still does not use it often. She uses it with colds and exercise.   Medications and allergies reviewed with patient and updated if appropriate.  Patient Active Problem List   Diagnosis Date Noted  . Asthma 03/12/2009  . CH[place]EN[place], HX OF 03/12/2009    No current outpatient prescriptions on file prior to visit.   No current facility-administered medications on file prior to visit.     Past Medical History:  Diagnosis Date  . Arthritis   . Childhood asthma     Past Surgical History:  Procedure Laterality Date  . BREAST BIOPSY Left 2015  . KNEE ARTHROSCOPY W/ ACL RECONSTRUCTION  10/2011   L knee - [facility]  . UMBILICAL HERNIA REPAIR  1973    Social History   Social History  . Marital status: Married    Spouse name: N/A  . Number of children: N/A  . Years of education: N/A   Social History Main Topics  . Smoking status: Never Smoker  . Smokeless tobacco: Never Used     Comment: Lives with domestic partner and 2 children  . Alcohol use Yes     Comment: occasional  . Drug use: No  . Sexual activity: Not Asked   Other Topics Concern  . 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 Bipolar disorder Father   . Diabetes type II Father   . Mental illness Father   . Hypertension Mother   . Hyperlipidemia Mother   . Lymphoma Sister   . Vasculitis Sister 43       NOS  . Cancer Paternal Grandmother        Breast  . Stroke Maternal  Grandmother   . Breast cancer Maternal Grandmother 82    Review of Systems  Constitutional: Negative for appetite change, chills, fatigue and fever.  Eyes: Negative for visual disturbance.  Respiratory: Negative for cough, shortness of breath and wheezing.   Cardiovascular: Negative for chest pain, palpitations and leg swelling.  Gastrointestinal: Negative for abdominal pain, blood in stool, constipation, diarrhea and nausea.       No gerd  Genitourinary: Negative for dysuria and hematuria.  Musculoskeletal: Negative for arthralgias and back pain.  Skin: Negative for color change and rash.  Neurological: Negative for dizziness, light-headedness and headaches.  Psychiatric/Behavioral: Negative for dysphoric mood. The patient is not nervous/anxious.        Objective:   Vitals:   01/22/17 0804  BP: 108/74  Pulse: 67  Resp: 16  Temp: 97.9 F (36.6 C)  SpO2: 98%   Filed Weights   01/22/17 0804  Weight: 182 lb (82.6 kg)   Body mass index is 27.67 kg/m.  Wt Readings from Last 3 Encounters:  01/22/17 182 lb (82.6 kg)  06/08/15 174  lb (78.9 kg)  03/06/12 164 lb (74.4 kg)     Physical Exam Constitutional: She appears well-developed and well-nourished. No distress.  HENT:  Head: Normocephalic and atraumatic.  Right Ear: External ear normal. Normal ear canal and TM Left Ear: External ear normal.  Normal ear canal and TM Mouth/Throat: Oropharynx is clear and moist.  Eyes: Conjunctivae and EOM are normal.  Neck: Neck supple. No tracheal deviation present. No thyromegaly present.  No carotid bruit  Cardiovascular: Normal rate, regular rhythm and normal heart sounds.   No murmur heard.  No edema. Pulmonary/Chest: Effort normal and breath sounds normal. No respiratory distress. She has no wheezes. She has no rales.  Breast: deferred to Gyn Abdominal: Soft. She exhibits no distension. There is no tenderness.  Lymphadenopathy: She has no cervical adenopathy.  Skin: Skin is  warm and dry. She is not diaphoretic. resolving rash right cheek Psychiatric: She has a normal mood and affect. Her behavior is normal.         Assessment & Plan:   Physical exam: Screening blood work ordered Immunizations  Up to date  Mammogram Up to date  Gyn  Up to date  Eye exams  - will schedule - has not seen one yet Exercise - regular 3-5 times a week Weight - overweight - will work on weight loss Skin rash on face - responded to otc eucerin Substance abuse   none  See Problem List for Assessment and Plan of chronic medical problems.   FU annually

## 2017-01-22 ENCOUNTER — Ambulatory Visit (INDEPENDENT_AMBULATORY_CARE_PROVIDER_SITE_OTHER): Payer: 59 | Admitting: Internal Medicine

## 2017-01-22 ENCOUNTER — Other Ambulatory Visit (INDEPENDENT_AMBULATORY_CARE_PROVIDER_SITE_OTHER): Payer: 59

## 2017-01-22 ENCOUNTER — Encounter: Payer: Self-pay | Admitting: Internal Medicine

## 2017-01-22 VITALS — BP 108/74 | HR 67 | Temp 97.9°F | Resp 16 | Ht 68.0 in | Wt 182.0 lb

## 2017-01-22 DIAGNOSIS — J4520 Mild intermittent asthma, uncomplicated: Secondary | ICD-10-CM

## 2017-01-22 DIAGNOSIS — Z0000 Encounter for general adult medical examination without abnormal findings: Secondary | ICD-10-CM

## 2017-01-22 LAB — LIPID PANEL
CHOLESTEROL: 202 mg/dL — AB (ref 0–200)
HDL: 51 mg/dL (ref >39.00)
LDL Cholesterol: 142 mg/dL — ABNORMAL HIGH (ref 0–99)
NonHDL: 151.12
Total CHOL/HDL Ratio: 4
Triglycerides: 46 mg/dL (ref 0.0–149.0)
VLDL: 9.2 mg/dL (ref 0.0–40.0)

## 2017-01-22 LAB — TSH: TSH: 2.11 u[IU]/mL (ref 0.35–4.50)

## 2017-01-22 LAB — COMPREHENSIVE METABOLIC PANEL
ALT: 12 U/L (ref 0–35)
AST: 15 U/L (ref 0–37)
Albumin: 4.3 g/dL (ref 3.5–5.2)
Alkaline Phosphatase: 37 U/L — ABNORMAL LOW (ref 39–117)
BUN: 17 mg/dL (ref 6–23)
CALCIUM: 8.8 mg/dL (ref 8.4–10.5)
CO2: 25 meq/L (ref 19–32)
Chloride: 105 mEq/L (ref 96–112)
Creatinine, Ser: 0.94 mg/dL (ref 0.40–1.20)
GFR: 67.73 mL/min (ref >60.00)
GLUCOSE: 103 mg/dL — AB (ref 70–99)
POTASSIUM: 3.9 meq/L (ref 3.5–5.1)
Sodium: 139 mEq/L (ref 135–145)
Total Bilirubin: 1.2 mg/dL (ref 0.2–1.2)
Total Protein: 6.8 g/dL (ref 6.0–8.3)

## 2017-01-22 LAB — CBC WITH DIFFERENTIAL/PLATELET
Basophils Absolute: 0.1 10*3/uL (ref 0.0–0.1)
Basophils Relative: 1.5 % (ref 0.0–3.0)
Eosinophils Absolute: 0.3 10*3/uL (ref 0.0–0.7)
Eosinophils Relative: 4.3 % (ref 0.0–5.0)
HEMATOCRIT: 42 % (ref 36.0–46.0)
Hemoglobin: 14.2 g/dL (ref 12.0–15.0)
LYMPHS ABS: 2 10*3/uL (ref 0.7–4.0)
Lymphocytes Relative: 29.7 % (ref 12.0–46.0)
MCHC: 33.8 g/dL (ref 30.0–36.0)
MCV: 94.5 fl (ref 78.0–100.0)
MONOS PCT: 8.5 % (ref 3.0–12.0)
Monocytes Absolute: 0.6 10*3/uL (ref 0.1–1.0)
NEUTROS ABS: 3.7 10*3/uL (ref 1.4–7.7)
NEUTROS PCT: 56 % (ref 43.0–77.0)
PLATELETS: 337 10*3/uL (ref 150.0–400.0)
RBC: 4.44 Mil/uL (ref 3.87–5.11)
RDW: 13.5 % (ref 11.5–15.5)
WBC: 6.6 10*3/uL (ref 4.0–10.5)

## 2017-01-22 MED ORDER — VENTOLIN HFA 108 (90 BASE) MCG/ACT IN AERS: 1.0000 | INHALATION_SPRAY | Freq: Four times a day (QID) | RESPIRATORY_TRACT | 8 refills | Status: DC | PRN

## 2017-01-22 NOTE — Assessment & Plan Note (Addendum)
Uses albuterol as needed, using it more often recently, but still not regularly Uses it with exercise and colds Albuterol refilled

## 2017-12-16 ENCOUNTER — Other Ambulatory Visit: Payer: Self-pay | Admitting: Internal Medicine

## 2020-02-23 ENCOUNTER — Telehealth: Payer: Self-pay | Admitting: Internal Medicine

## 2020-02-23 NOTE — Telephone Encounter (Signed)
    Patient last saw you in August 2018, just beyond 3 year mark. Are you ok with re-establishing with her?

## 2020-02-23 NOTE — Telephone Encounter (Signed)
Yes, ok 

## 2020-04-23 ENCOUNTER — Ambulatory Visit: Payer: 59 | Admitting: Internal Medicine

## 2020-05-13 NOTE — Patient Instructions (Addendum)
Blood work was ordered.     Tetanus and shingrix immunization administered today.   Medications changes include :    No  Your prescription(s) have been submitted to your pharmacy.    A referral was ordered for GI       Someone will call you to schedule an appointment.    Please followup in 1 years    Health Maintenance, Female Adopting a healthy lifestyle and getting preventive care are important in promoting health and wellness. Ask your health care provider about:  The right schedule for you to have regular tests and exams.  Things you can do on your own to prevent diseases and keep yourself healthy. What should I know about diet, weight, and exercise? Eat a healthy diet   Eat a diet that includes plenty of vegetables, fruits, low-fat dairy products, and lean protein.  Do not eat a lot of foods that are high in solid fats, added sugars, or sodium. Maintain a healthy weight Body mass index (BMI) is used to identify weight problems. It estimates body fat based on height and weight. Your health care provider can help determine your BMI and help you achieve or maintain a healthy weight. Get regular exercise Get regular exercise. This is one of the most important things you can do for your health. Most adults should:  Exercise for at least 150 minutes each week. The exercise should increase your heart rate and make you sweat (moderate-intensity exercise).  Do strengthening exercises at least twice a week. This is in addition to the moderate-intensity exercise.  Spend less time sitting. Even light physical activity can be beneficial. Watch cholesterol and blood lipids Have your blood tested for lipids and cholesterol at 50 years of age, then have this test every 5 years. Have your cholesterol levels checked more often if:  Your lipid or cholesterol levels are high.  You are older than 49 years of age.  You are at high risk for heart disease. What should I know about  cancer screening? Depending on your health history and family history, you may need to have cancer screening at various ages. This may include screening for:  Breast cancer.  Cervical cancer.  Colorectal cancer.  Skin cancer.  Lung cancer. What should I know about heart disease, diabetes, and high blood pressure? Blood pressure and heart disease  High blood pressure causes heart disease and increases the risk of stroke. This is more likely to develop in people who have high blood pressure readings, are of African descent, or are overweight.  Have your blood pressure checked: ? Every 3-5 years if you are 85-30 years of age. ? Every year if you are 29 years old or older. Diabetes Have regular diabetes screenings. This checks your fasting blood sugar level. Have the screening done:  Once every three years after age 63 if you are at a normal weight and have a low risk for diabetes.  More often and at a younger age if you are overweight or have a high risk for diabetes. What should I know about preventing infection? Hepatitis B If you have a higher risk for hepatitis B, you should be screened for this virus. Talk with your health care provider to find out if you are at risk for hepatitis B infection. Hepatitis C Testing is recommended for:  Everyone born from [birth year] through 1966.  Anyone with known risk factors for hepatitis C. Sexually transmitted infections (STIs)  Get screened for STIs, including gonorrhea and chlamydia,  if: ? You are sexually active and are younger than 50 years of age. ? You are older than 50 years of age and your health care provider tells you that you are at risk for this type of infection. ? Your sexual activity has changed since you were last screened, and you are at increased risk for chlamydia or gonorrhea. Ask your health care provider if you are at risk.  Ask your health care provider about whether you are at high risk for HIV. Your health care  provider may recommend a prescription medicine to help prevent HIV infection. If you choose to take medicine to prevent HIV, you should first get tested for HIV. You should then be tested every 3 months for as long as you are taking the medicine. Pregnancy  If you are about to stop having your period (premenopausal) and you may become pregnant, seek counseling before you get pregnant.  Take 400 to 800 micrograms (mcg) of folic acid every day if you become pregnant.  Ask for birth control (contraception) if you want to prevent pregnancy. Osteoporosis and menopause Osteoporosis is a disease in which the bones lose minerals and strength with aging. This can result in bone fractures. If you are 85 years old or older, or if you are at risk for osteoporosis and fractures, ask your health care provider if you should:  Be screened for bone loss.  Take a calcium or vitamin D supplement to lower your risk of fractures.  Be given hormone replacement therapy (HRT) to treat symptoms of menopause. Follow these instructions at home: Lifestyle  Do not use any products that contain nicotine or tobacco, such as cigarettes, e-cigarettes, and chewing tobacco. If you need help quitting, ask your health care provider.  Do not use street drugs.  Do not share needles.  Ask your health care provider for help if you need support or information about quitting drugs. Alcohol use  Do not drink alcohol if: ? Your health care provider tells you not to drink. ? You are pregnant, may be pregnant, or are planning to become pregnant.  If you drink alcohol: ? Limit how much you use to 0-1 drink a day. ? Limit intake if you are breastfeeding.  Be aware of how much alcohol is in your drink. In the [place] one drink equals one 12 oz bottle of beer (355 mL), one 5 oz glass of wine (148 mL), or one 1 oz glass of hard liquor (44 mL). General instructions  Schedule regular health, dental, and eye exams.  Stay current  with your vaccines.  Tell your health care provider if: ? You often feel depressed. ? You have ever been abused or do not feel safe at home. Summary  Adopting a healthy lifestyle and getting preventive care are important in promoting health and wellness.  Follow your health care provider's instructions about healthy diet, exercising, and getting tested or screened for diseases.  Follow your health care provider's instructions on monitoring your cholesterol and blood pressure. This information is not intended to replace advice given to you by your health care provider. Make sure you discuss any questions you have with your health care provider. Document Revised: 05/22/2018 Document Reviewed: 05/22/2018 Elsevier Patient Education  2020 [employer].

## 2020-05-13 NOTE — Progress Notes (Signed)
Subjective:    Patient ID: Kristina Booth, female    DOB: 01/30/1971, 49 y.o.   MRN: [id number]   This visit occurred during the SARS-CoV-2 public health emergency.  Safety protocols were in place, including screening questions prior to the visit, additional usage of staff PPE, and extensive cleaning of exam room while observing appropriate contact time as indicated for disinfecting solutions.    HPI She is here for a physical exam.   She has not been here in about 3 years. She denies any major changes in her health and has no concerns. She needs to get caught up with her health maintenance.  Medications and allergies reviewed with patient and updated if appropriate.  Patient Active Problem List   Diagnosis Date Noted  . Asthma 03/12/2009  . CHICKENPOX, HX OF 03/12/2009    No current outpatient medications on file prior to visit.   No current facility-administered medications on file prior to visit.    Past Medical History:  Diagnosis Date  . Arthritis   . Childhood asthma     Past Surgical History:  Procedure Laterality Date  . BREAST BIOPSY Left 2015  . KNEE ARTHROSCOPY W/ ACL RECONSTRUCTION  10/2011   L knee - [place]  . UMBILICAL HERNIA REPAIR  1973    Social History   Socioeconomic History  . Marital status: Married    Spouse name: Not on file  . Number of children: Not on file  . Years of education: Not on file  . Highest education level: Not on file  Occupational History  . Not on file  Tobacco Use  . Smoking status: Never Smoker  . Smokeless tobacco: Never Used  . Tobacco comment: Lives with domestic partner and 2 children  Substance and Sexual Activity  . Alcohol use: Yes    Comment: occasional  . Drug use: No  . Sexual activity: Not on file  Other Topics Concern  . Not on file  Social History Narrative   Exercise, 3-5 times a week      2 children, 9 and 12      PA at [employer]      Social Determinants of Health   Financial  Resource Strain:   . Difficulty of Paying Living Expenses: Not on file  Food Insecurity:   . Worried About Programme researcher, broadcasting/film/video in the Last Year: Not on file  . Ran Out of Food in the Last Year: Not on file  Transportation Needs:   . Lack of Transportation (Medical): Not on file  . Lack of Transportation (Non-Medical): Not on file  Physical Activity:   . Days of Exercise per Week: Not on file  . Minutes of Exercise per Session: Not on file  Stress:   . Feeling of Stress : Not on file  Social Connections:   . Frequency of Communication with Friends and Family: Not on file  . Frequency of Social Gatherings with Friends and Family: Not on file  . Attends Religious Services: Not on file  . Active Member of Clubs or Organizations: Not on file  . Attends Banker Meetings: Not on file  . Marital Status: Not on file    Family History  Problem Relation Age of Onset  . Bipolar disorder Father   . Diabetes type II Father   . Mental illness Father   . Hypertension Mother   . Hyperlipidemia Mother   . Lymphoma Sister   . Vasculitis Sister 67  NOS  . Cancer Paternal Grandmother        Breast  . Stroke Maternal Grandmother   . Breast cancer Maternal Grandmother 82    Review of Systems  Constitutional: Negative for fever.  Eyes: Negative for visual disturbance.  Respiratory: Positive for wheezing (a little ). Negative for cough and shortness of breath.   Cardiovascular: Negative for chest pain, palpitations and leg swelling.  Gastrointestinal: Negative for abdominal pain, blood in stool, constipation, diarrhea and nausea.  Musculoskeletal: Positive for arthralgias. Negative for back pain.  Neurological: Negative for dizziness, light-headedness and headaches.       Objective:   Vitals:   05/14/20 1525  BP: 114/80  Pulse: 86  Temp: 98.1 F (36.7 C)  SpO2: 97%   Filed Weights   05/14/20 1525  Weight: 189 lb 12.8 oz (86.1 kg)   Body mass index is 28.86  kg/m.  BP Readings from Last 3 Encounters:  05/14/20 114/80  01/22/17 108/74  06/08/15 108/78    Wt Readings from Last 3 Encounters:  05/14/20 189 lb 12.8 oz (86.1 kg)  01/22/17 182 lb (82.6 kg)  06/08/15 174 lb (78.9 kg)     Physical Exam Constitutional: She appears well-developed and well-nourished. No distress.  HENT:  Head: Normocephalic and atraumatic.  Right Ear: External ear normal. Normal ear canal and TM Left Ear: External ear normal.  Normal ear canal and TM Mouth/Throat: Oropharynx is clear and moist.  Eyes: Conjunctivae and EOM are normal.  Neck: Neck supple. No tracheal deviation present. No thyromegaly present.  No carotid bruit  Cardiovascular: Normal rate, regular rhythm and normal heart sounds.   No murmur heard.  No edema. Pulmonary/Chest: Effort normal and breath sounds normal. No respiratory distress. She has no wheezes. She has no rales.  Breast: deferred   Abdominal: Soft. She exhibits no distension. There is no tenderness.  Lymphadenopathy: She has no cervical adenopathy.  Skin: Skin is warm and dry. She is not diaphoretic.  Psychiatric: She has a normal mood and affect. Her behavior is normal.        Assessment & Plan:   Physical exam: Screening blood work    ordered Immunizations Tdap today, Shingrix No. 1 today Colonoscopy  Due - referred Mammogram  Due - will order Gyn  Due - will schedule Eye exams  Due - will schedule Exercise  intermittent Weight  Working on weight loss Substance abuse  none      See Problem List for Assessment and Plan of chronic medical problems.

## 2020-05-14 ENCOUNTER — Other Ambulatory Visit: Payer: Self-pay

## 2020-05-14 ENCOUNTER — Encounter: Payer: Self-pay | Admitting: Internal Medicine

## 2020-05-14 ENCOUNTER — Ambulatory Visit (INDEPENDENT_AMBULATORY_CARE_PROVIDER_SITE_OTHER): Payer: 59 | Admitting: Internal Medicine

## 2020-05-14 VITALS — BP 114/80 | HR 86 | Temp 98.1°F | Ht 68.0 in | Wt 189.8 lb

## 2020-05-14 DIAGNOSIS — Z0000 Encounter for general adult medical examination without abnormal findings: Secondary | ICD-10-CM | POA: Diagnosis not present

## 2020-05-14 DIAGNOSIS — Z23 Encounter for immunization: Secondary | ICD-10-CM

## 2020-05-14 DIAGNOSIS — Z1231 Encounter for screening mammogram for malignant neoplasm of breast: Secondary | ICD-10-CM

## 2020-05-14 DIAGNOSIS — Z1211 Encounter for screening for malignant neoplasm of colon: Secondary | ICD-10-CM

## 2020-05-14 DIAGNOSIS — J4520 Mild intermittent asthma, uncomplicated: Secondary | ICD-10-CM | POA: Diagnosis not present

## 2020-05-14 DIAGNOSIS — R739 Hyperglycemia, unspecified: Secondary | ICD-10-CM

## 2020-05-14 MED ORDER — VENTOLIN HFA 108 (90 BASE) MCG/ACT IN AERS
1.0000 | INHALATION_SPRAY | Freq: Four times a day (QID) | RESPIRATORY_TRACT | 8 refills | Status: DC | PRN
Start: 2020-05-14 — End: 2020-05-21

## 2020-05-14 MED ORDER — MULTIVITAMINS PO CAPS: 1.0000 | ORAL_CAPSULE | Freq: Every day | ORAL | Status: AC

## 2020-05-14 NOTE — Assessment & Plan Note (Addendum)
Chronic Has a cat at home which has worsened her asthma Using albuterol daily Advised to start daily antihistamine Deferred maintenance inhaler at this time She is having the carpets removed in her bedroom and parts of the house, which will likely help

## 2020-05-14 NOTE — Assessment & Plan Note (Signed)
Was not completely fasting with her last blood work 3 years ago We will check A1c

## 2020-05-18 ENCOUNTER — Encounter: Payer: Self-pay | Admitting: Gastroenterology

## 2020-05-20 ENCOUNTER — Other Ambulatory Visit: Payer: Self-pay | Admitting: Internal Medicine

## 2020-05-21 ENCOUNTER — Other Ambulatory Visit: Payer: Self-pay

## 2020-05-21 ENCOUNTER — Other Ambulatory Visit: Payer: Self-pay | Admitting: Internal Medicine

## 2020-05-21 MED ORDER — VENTOLIN HFA 108 (90 BASE) MCG/ACT IN AERS
INHALATION_SPRAY | RESPIRATORY_TRACT | 8 refills | Status: DC
Start: 2020-05-21 — End: 2020-05-27

## 2020-05-21 NOTE — Telephone Encounter (Signed)
On Monday can you call her and see if she wants to try the generic

## 2020-05-24 ENCOUNTER — Other Ambulatory Visit (INDEPENDENT_AMBULATORY_CARE_PROVIDER_SITE_OTHER): Payer: 59

## 2020-05-24 ENCOUNTER — Other Ambulatory Visit: Payer: Self-pay

## 2020-05-24 DIAGNOSIS — R739 Hyperglycemia, unspecified: Secondary | ICD-10-CM | POA: Diagnosis not present

## 2020-05-24 DIAGNOSIS — Z0000 Encounter for general adult medical examination without abnormal findings: Secondary | ICD-10-CM

## 2020-05-24 LAB — CBC WITH DIFFERENTIAL/PLATELET
Basophils Absolute: 0.1 10*3/uL (ref 0.0–0.1)
Basophils Relative: 1.3 % (ref 0.0–3.0)
Eosinophils Absolute: 0.4 10*3/uL (ref 0.0–0.7)
Eosinophils Relative: 4.7 % (ref 0.0–5.0)
HCT: 40.2 % (ref 36.0–46.0)
Hemoglobin: 13.8 g/dL (ref 12.0–15.0)
Lymphocytes Relative: 30.9 % (ref 12.0–46.0)
Lymphs Abs: 2.3 10*3/uL (ref 0.7–4.0)
MCHC: 34.4 g/dL (ref 30.0–36.0)
MCV: 92.5 fl (ref 78.0–100.0)
Monocytes Absolute: 0.5 10*3/uL (ref 0.1–1.0)
Monocytes Relative: 7.2 % (ref 3.0–12.0)
Neutro Abs: 4.2 10*3/uL (ref 1.4–7.7)
Neutrophils Relative %: 55.9 % (ref 43.0–77.0)
Platelets: 350 10*3/uL (ref 150.0–400.0)
RBC: 4.34 Mil/uL (ref 3.87–5.11)
RDW: 13.1 % (ref 11.5–15.5)
WBC: 7.5 10*3/uL (ref 4.0–10.5)

## 2020-05-24 LAB — COMPREHENSIVE METABOLIC PANEL
ALT: 11 U/L (ref 0–35)
AST: 13 U/L (ref 0–37)
Albumin: 4 g/dL (ref 3.5–5.2)
Alkaline Phosphatase: 34 U/L — ABNORMAL LOW (ref 39–117)
BUN: 9 mg/dL (ref 6–23)
CO2: 27 mEq/L (ref 19–32)
Calcium: 8.9 mg/dL (ref 8.4–10.5)
Chloride: 103 mEq/L (ref 96–112)
Creatinine, Ser: 0.88 mg/dL (ref 0.40–1.20)
GFR: 76.48 mL/min (ref >60.00)
Glucose, Bld: 96 mg/dL (ref 70–99)
Potassium: 4 mEq/L (ref 3.5–5.1)
Sodium: 138 mEq/L (ref 135–145)
Total Bilirubin: 0.9 mg/dL (ref 0.2–1.2)
Total Protein: 7 g/dL (ref 6.0–8.3)

## 2020-05-24 LAB — TSH: TSH: 2.66 u[IU]/mL (ref 0.35–4.50)

## 2020-05-24 LAB — LIPID PANEL
Cholesterol: 206 mg/dL — ABNORMAL HIGH (ref 0–200)
HDL: 49.8 mg/dL (ref >39.00)
LDL Cholesterol: 132 mg/dL — ABNORMAL HIGH (ref 0–99)
NonHDL: 156.35
Total CHOL/HDL Ratio: 4
Triglycerides: 124 mg/dL (ref 0.0–149.0)
VLDL: 24.8 mg/dL (ref 0.0–40.0)

## 2020-05-24 LAB — HEMOGLOBIN A1C: Hgb A1c MFr Bld: 5.4 % (ref 4.6–6.5)

## 2020-05-26 NOTE — Progress Notes (Signed)
Your cholesterol is similar to what it has been.  Total cholesterol 206, triglycerides 124, HDL 50, LDL 132.  Ideally this could be lower, but there is no need for medication.  Your kidney function, liver tests, blood counts, sugar and thyroid function are normal.

## 2020-07-07 ENCOUNTER — Other Ambulatory Visit: Payer: Self-pay

## 2020-07-07 ENCOUNTER — Ambulatory Visit
Admission: RE | Admit: 2020-07-07 | Discharge: 2020-07-07 | Disposition: A | Discharge status: Home / Self Care | Payer: 59 | Source: Ambulatory Visit | Attending: Internal Medicine | Admitting: Internal Medicine

## 2020-07-07 DIAGNOSIS — Z1231 Encounter for screening mammogram for malignant neoplasm of breast: Secondary | ICD-10-CM

## 2020-07-09 ENCOUNTER — Ambulatory Visit (AMBULATORY_SURGERY_CENTER): Payer: 59

## 2020-07-09 ENCOUNTER — Other Ambulatory Visit: Payer: Self-pay

## 2020-07-09 VITALS — Ht 68.0 in | Wt 190.0 lb

## 2020-07-09 DIAGNOSIS — Z1211 Encounter for screening for malignant neoplasm of colon: Secondary | ICD-10-CM

## 2020-07-09 NOTE — Progress Notes (Signed)
Pt verified name, DOB, address and insurance during PV today.   Pt mailed instruction packet to included paper to complete and mail back to [facility] with addressed and stamped envelope, copy of consent form to read and not return, and instructions.PV completed over the phone. Pt encouraged to call with questions or issues.   No allergies to soy or egg Pt is not on blood thinners or diet pills Denies issues with sedation/intubation Denies atrial flutter/fib Denies constipation   Pt is aware of Covid safety and care partner requirements.   Self report wt: 190lb today

## 2020-07-21 ENCOUNTER — Encounter: Payer: Self-pay | Admitting: Gastroenterology

## 2020-07-23 ENCOUNTER — Encounter: Payer: Self-pay | Admitting: Gastroenterology

## 2020-07-23 ENCOUNTER — Ambulatory Visit (AMBULATORY_SURGERY_CENTER): Payer: 59 | Admitting: Gastroenterology

## 2020-07-23 ENCOUNTER — Other Ambulatory Visit: Payer: Self-pay

## 2020-07-23 VITALS — BP 120/87 | HR 66 | Temp 98.2°F | Resp 16 | Ht 68.0 in | Wt 190.0 lb

## 2020-07-23 DIAGNOSIS — Z1211 Encounter for screening for malignant neoplasm of colon: Secondary | ICD-10-CM | POA: Diagnosis not present

## 2020-07-23 MED ORDER — SODIUM CHLORIDE 0.9 % IV SOLN: 500.0000 mL | Freq: Once | INTRAVENOUS | Status: DC

## 2020-07-23 NOTE — Op Note (Signed)
[place] Patient Name: Kristina Booth Procedure Date: 07/23/2020 7:55 AM MRN: [id number] Endoscopist: Justice Britain , MD Age: 50 Referring MD:  Date of Birth: [birth year] Gender: Female Account #: [id number] Procedure:                Colonoscopy Indications:              Screening for colorectal malignant neoplasm, This                            is the patient's first colonoscopy Medicines:                Monitored Anesthesia Care Procedure:                Pre-Anesthesia Assessment:                           - Prior to the procedure, a History and Physical                            was performed, and patient medications and                            allergies were reviewed. The patient's tolerance of                            previous anesthesia was also reviewed. The risks                            and benefits of the procedure and the sedation                            options and risks were discussed with the patient.                            All questions were answered, and informed consent                            was obtained. Prior Anticoagulants: The patient has                            taken no previous anticoagulant or antiplatelet                            agents. ASA Grade Assessment: II - A patient with                            mild systemic disease. After reviewing the risks                            and benefits, the patient was deemed in                            satisfactory condition to undergo the procedure.  After obtaining informed consent, the colonoscope                            was passed under direct vision. Throughout the                            procedure, the patient's blood pressure, pulse, and                            oxygen saturations were monitored continuously. The                            Olympus CF-[id number]) Colonoscope was                            introduced through the anus  and advanced to the 5                            cm into the ileum. The colonoscopy was performed                            without difficulty. The patient tolerated the                            procedure. The quality of the bowel preparation was                            good. The terminal ileum, ileocecal valve,                            appendiceal orifice, and rectum were photographed. Scope In: 8:01:54 AM Scope Out: 8:16:34 AM Scope Withdrawal Time: 0 hours 9 minutes 43 seconds  Total Procedure Duration: 0 hours 14 minutes 40 seconds  Findings:                 The digital rectal exam findings include                            hemorrhoids. Pertinent negatives include no                            palpable rectal lesions.                           Multiple small-mouthed diverticula were found in                            the recto-sigmoid colon and sigmoid colon.                           Normal mucosa was found in the entire colon.                           Non-bleeding non-thrombosed internal hemorrhoids  were found during retroflexion, during perianal                            exam and during digital exam. The hemorrhoids were                            Grade II (internal hemorrhoids that prolapse but                            reduce spontaneously). Complications:            No immediate complications. Estimated Blood Loss:     Estimated blood loss was minimal. Estimated blood                            loss: none. Impression:               - Hemorrhoids found on digital rectal exam.                           - Diverticulosis in the recto-sigmoid colon and in                            the sigmoid colon.                           - Normal mucosa in the entire examined colon.                           - Non-bleeding non-thrombosed internal hemorrhoids. Recommendation:           - The patient will be observed post-procedure,                             until all discharge criteria are met.                           - Discharge patient to home.                           - Patient has a contact number available for                            emergencies. The signs and symptoms of potential                            delayed complications were discussed with the                            patient. Return to normal activities tomorrow.                            Written discharge instructions were provided to the                            patient.                           -  High fiber diet.                           - Use FiberCon 1-2 tablets PO daily.                           - Continue present medications.                           - Repeat colonoscopy in 10 years for screening                            purposes.                           - The findings and recommendations were discussed                            with the patient.                           - The findings and recommendations were discussed                            with the patient's family. Justice Britain, MD 07/23/2020 8:23:56 AM

## 2020-07-23 NOTE — Progress Notes (Signed)
Medical history reviewed with no changes noted. VS assessed by C.W 

## 2020-07-23 NOTE — Patient Instructions (Signed)
Handouts given for diverticulosis and high fiber diet.  YOU HAD AN ENDOSCOPIC PROCEDURE TODAY AT THE [facility]:   Refer to the procedure report that was given to you for any specific questions about what was found during the examination.  If the procedure report does not answer your questions, please call your gastroenterologist to clarify.  If you requested that your care partner not be given the details of your procedure findings, then the procedure report has been included in a sealed envelope for you to review at your convenience later.  YOU SHOULD EXPECT: Some feelings of bloating in the abdomen. Passage of more gas than usual.  Walking can help get rid of the air that was put into your GI tract during the procedure and reduce the bloating. If you had a lower endoscopy (such as a colonoscopy or flexible sigmoidoscopy) you may notice spotting of blood in your stool or on the toilet paper. If you underwent a bowel prep for your procedure, you may not have a normal bowel movement for a few days.  Please Note:  You might notice some irritation and congestion in your nose or some drainage.  This is from the oxygen used during your procedure.  There is no need for concern and it should clear up in a day or so.  SYMPTOMS TO REPORT IMMEDIATELY:   Following lower endoscopy (colonoscopy or flexible sigmoidoscopy):  Excessive amounts of blood in the stool  Significant tenderness or worsening of abdominal pains  Swelling of the abdomen that is new, acute  Fever of 100F or higher  For urgent or emergent issues, a gastroenterologist can be reached at any hour by calling [phone]. Do not use MyChart messaging for urgent concerns.    DIET:  We do recommend a small meal at first, but then you may proceed to your regular diet.  Drink plenty of fluids but you should avoid alcoholic beverages for 24 hours.  ACTIVITY:  You should plan to take it easy for the rest of today and you should  NOT DRIVE or use heavy machinery until tomorrow (because of the sedation medicines used during the test).    FOLLOW UP: Our staff will call the number listed on your records 48-72 hours following your procedure to check on you and address any questions or concerns that you may have regarding the information given to you following your procedure. If we do not reach you, we will leave a message.  We will attempt to reach you two times.  During this call, we will ask if you have developed any symptoms of COVID 19. If you develop any symptoms (ie: fever, flu-like symptoms, shortness of breath, cough etc.) before then, please call [phone].  If you test positive for Covid 19 in the 2 weeks post procedure, please call and report this information to us.    If any biopsies were taken you will be contacted by phone or by letter within the next 1-3 weeks.  Please call us at [phone] if you have not heard about the biopsies in 3 weeks.    SIGNATURES/CONFIDENTIALITY: You and/or your care partner have signed paperwork which will be entered into your electronic medical record.  These signatures attest to the fact that that the information above on your After Visit Summary has been reviewed and is understood.  Full responsibility of the confidentiality of this discharge information lies with you and/or your care-partner. 

## 2020-07-23 NOTE — Progress Notes (Signed)
Report given to PACU, vss 

## 2020-07-27 ENCOUNTER — Telehealth: Payer: Self-pay | Admitting: *Deleted

## 2020-07-27 NOTE — Telephone Encounter (Signed)
  Follow up Call-  Call back number 07/23/2020  Post procedure Call Back phone  # [phone]  Permission to leave phone message Yes  Some recent data might be hidden     Patient questions:  Do you have a fever, pain , or abdominal swelling? No. Pain Score  0 *  Have you tolerated food without any problems? Yes.    Have you been able to return to your normal activities? Yes.    Do you have any questions about your discharge instructions: Diet   No. Medications  No. Follow up visit  No.  Do you have questions or concerns about your Care? No.  Actions: * If pain score is 4 or above: No action needed, pain <4  1. Have you developed a fever since your procedure? No  2.   Have you had an respiratory symptoms (SOB or cough) since your procedure? NO  3.   Have you tested positive for COVID 19 since your procedure NO  4.   Have you had any family members/close contacts diagnosed with the COVID 19 since your procedure?  NO   If yes to any of these questions please route to Laverna Peace, RN and Karlton Lemon, RN

## 2021-02-16 ENCOUNTER — Other Ambulatory Visit: Payer: Self-pay | Admitting: Internal Medicine

## 2021-07-18 ENCOUNTER — Encounter: Payer: Self-pay | Admitting: Internal Medicine

## 2021-07-18 NOTE — Progress Notes (Signed)
Subjective:    Patient ID: Kristina Booth, female    DOB: Nov 24, 1970, 51 y.o.   MRN: [id number]   This visit occurred during the SARS-CoV-2 public health emergency.  Safety protocols were in place, including screening questions prior to the visit, additional usage of staff PPE, and extensive cleaning of exam room while observing appropriate contact time as indicated for disinfecting solutions.    HPI She is here for a physical exam.   Overall doing well.  Wants to loose weight.  She is exercising regularly.  Does really good during the week, but it is harder during the weekend.     Medications and allergies reviewed with patient and updated if appropriate.  Patient Active Problem List   Diagnosis Date Noted   Hyperglycemia 05/14/2020   Asthma 03/12/2009   CHICKEN[place], HX OF 03/12/2009    Current Outpatient Medications on File Prior to Visit  Medication Sig Dispense Refill   albuterol (VENTOLIN HFA) 108 (90 Base) MCG/ACT inhaler INHALE 1-2 PUFFS BY MOUTH EVERY 6 HOURS AS NEEDED FOR WHEEZE OR SHORTNESS OF BREATH 6.7 each 8   Calcium Carb-Cholecalciferol (CALCIUM 1000 + D PO) Take by mouth.     Multiple Vitamin (MULTIVITAMIN) capsule Take 1 capsule by mouth daily.     Omega-3 Fatty Acids (FISH OIL PO) Take by mouth.     No current facility-administered medications on file prior to visit.    Past Medical History:  Diagnosis Date   Allergy    environmental   Arthritis    Childhood asthma     Past Surgical History:  Procedure Laterality Date   BREAST BIOPSY Left 2015   KNEE ARTHROSCOPY W/ ACL RECONSTRUCTION  10/2011   L knee - [place]   UMBILICAL HERNIA REPAIR  1973    Social History   Socioeconomic History   Marital status: Married    Spouse name: Not on file   Number of children: Not on file   Years of education: Not on file   Highest education level: Not on file  Occupational History   Not on file  Tobacco Use   Smoking status: Never   Smokeless tobacco:  Never   Tobacco comments:    Lives with domestic partner and 2 children  Vaping Use   Vaping Use: Never used  Substance and Sexual Activity   Alcohol use: Yes    Comment: occasional   Drug use: No   Sexual activity: Not on file  Other Topics Concern   Not on file  Social History Narrative   Exercise, 3-5 times a week      2 children, 9 and 12      PA at [employer]      Social Determinants of Health   Financial Resource Strain: Not on file  Food Insecurity: Not on file  Transportation Needs: Not on file  Physical Activity: Not on file  Stress: Not on file  Social Connections: Not on file    Family History  Problem Relation Age of Onset   Bipolar disorder Father    Diabetes type II Father    Mental illness Father    Hypertension Mother    Hyperlipidemia Mother    Lymphoma Sister    Vasculitis Sister 1       NOS   Cancer Paternal Grandmother        Breast   Stroke Maternal Grandmother    Breast cancer Maternal Grandmother 82   Colon cancer Neg Hx  Colon polyps Neg Hx    Esophageal cancer Neg Hx    Stomach cancer Neg Hx    Rectal cancer Neg Hx     Review of Systems  Constitutional:  Negative for chills and fever.  Eyes:  Negative for visual disturbance.  Respiratory:  Positive for wheezing. Negative for cough and shortness of breath.   Cardiovascular:  Negative for chest pain, palpitations and leg swelling.  Gastrointestinal:  Negative for abdominal pain, blood in stool, constipation, diarrhea and nausea.       No gerd  Genitourinary:  Negative for dysuria.  Musculoskeletal:  Positive for arthralgias.  Skin:  Negative for color change and rash.  Neurological:  Negative for dizziness, light-headedness and headaches.  Psychiatric/Behavioral:  Negative for dysphoric mood and sleep disturbance. The patient is not nervous/anxious.       Objective:   Vitals:   07/19/21 1514  BP: 112/72  Pulse: 66  Temp: 97.9 F (36.6 C)  SpO2: 98%   Filed  Weights   07/19/21 1514  Weight: 192 lb (87.1 kg)   Body mass index is 29.19 kg/m.  BP Readings from Last 3 Encounters:  07/19/21 112/72  07/23/20 120/87  05/14/20 114/80    Wt Readings from Last 3 Encounters:  07/19/21 192 lb (87.1 kg)  07/23/20 190 lb (86.2 kg)  07/09/20 190 lb (86.2 kg)    Depression screen [facility] 2/9 07/19/2021 05/14/2020  Decreased Interest 0 0  Down, Depressed, Hopeless 0 0  PHQ - 2 Score 0 0     No flowsheet data found.     Physical Exam Constitutional: She appears well-developed and well-nourished. No distress.  HENT:  Head: Normocephalic and atraumatic.  Right Ear: External ear normal. Normal ear canal and TM Left Ear: External ear normal.  Normal ear canal and TM Mouth/Throat: Oropharynx is clear and moist.  Eyes: Conjunctivae and EOM are normal.  Neck: Neck supple. No tracheal deviation present. No thyromegaly present.  No carotid bruit  Cardiovascular: Normal rate, regular rhythm and normal heart sounds.   No murmur heard.  No edema. Pulmonary/Chest: Effort normal and breath sounds normal. No respiratory distress. She has no wheezes. She has no rales.  Breast: deferred   Abdominal: Soft. She exhibits no distension. There is no tenderness.  Lymphadenopathy: She has no cervical adenopathy.  Skin: Skin is warm and dry. She is not diaphoretic.  Psychiatric: She has a normal mood and affect. Her behavior is normal.     Lab Results  Component Value Date   WBC 7.5 05/24/2020   HGB 13.8 05/24/2020   HCT 40.2 05/24/2020   PLT 350.0 05/24/2020   GLUCOSE 96 05/24/2020   CHOL 206 (H) 05/24/2020   TRIG 124.0 05/24/2020   HDL 49.80 05/24/2020   LDLCALC 132 (H) 05/24/2020   ALT 11 05/24/2020   AST 13 05/24/2020   NA 138 05/24/2020   K 4.0 05/24/2020   CL 103 05/24/2020   CREATININE 0.88 05/24/2020   BUN 9 05/24/2020   CO2 27 05/24/2020   TSH 2.66 05/24/2020   HGBA1C 5.4 05/24/2020         Assessment & Plan:   Physical  exam: Screening blood work  ordered Exercise  Regular - 3-4 times a week Weight  will work on weight loss Substance abuse  none   Reviewed recommended immunizations.   Health Maintenance  Topic Date Due   PAP SMEAR-Modifier  06/27/2016   Hepatitis C Screening  05/14/2065 (Originally 09/10/1987)   MAMMOGRAM  07/07/2022   TETANUS/TDAP  05/14/2030   COLONOSCOPY (Pts 45-21yrs Insurance coverage will need to be confirmed)  07/23/2030   INFLUENZA VACCINE  Completed   COVID-19 Vaccine  Completed   HIV Screening  Completed   Zoster Vaccines- Shingrix  Completed   HPV VACCINES  Aged Out          See Problem List for Assessment and Plan of chronic medical problems.

## 2021-07-18 NOTE — Patient Instructions (Addendum)
 Blood work was ordered.     Medications changes include :   none  Your prescription(s) have been submitted to your pharmacy. Please take as directed and contact our office if you believe you are having problem(s) with the medication(s).    Please followup in 1 year  Health Maintenance, Female Adopting a healthy lifestyle and getting preventive care are important in promoting health and wellness. Ask your health care provider about: The right schedule for you to have regular tests and exams. Things you can do on your own to prevent diseases and keep yourself healthy. What should I know about diet, weight, and exercise? Eat a healthy diet  Eat a diet that includes plenty of vegetables, fruits, low-fat dairy products, and lean protein. Do not eat a lot of foods that are high in solid fats, added sugars, or sodium. Maintain a healthy weight Body mass index (BMI) is used to identify weight problems. It estimates body fat based on height and weight. Your health care provider can help determine your BMI and help you achieve or maintain a healthy weight. Get regular exercise Get regular exercise. This is one of the most important things you can do for your health. Most adults should: Exercise for at least 150 minutes each week. The exercise should increase your heart rate and make you sweat (moderate-intensity exercise). Do strengthening exercises at least twice a week. This is in addition to the moderate-intensity exercise. Spend less time sitting. Even light physical activity can be beneficial. Watch cholesterol and blood lipids Have your blood tested for lipids and cholesterol at 52 years of age, then have this test every 5 years. Have your cholesterol levels checked more often if: Your lipid or cholesterol levels are high. You are older than 52 years of age. You are at high risk for heart disease. What should I know about cancer screening? Depending on your health history and family  history, you may need to have cancer screening at various ages. This may include screening for: Breast cancer. Cervical cancer. Colorectal cancer. Skin cancer. Lung cancer. What should I know about heart disease, diabetes, and high blood pressure? Blood pressure and heart disease High blood pressure causes heart disease and increases the risk of stroke. This is more likely to develop in people who have high blood pressure readings or are overweight. Have your blood pressure checked: Every 3-5 years if you are 18-39 years of age. Every year if you are 40 years old or older. Diabetes Have regular diabetes screenings. This checks your fasting blood sugar level. Have the screening done: Once every three years after age 40 if you are at a normal weight and have a low risk for diabetes. More often and at a younger age if you are overweight or have a high risk for diabetes. What should I know about preventing infection? Hepatitis B If you have a higher risk for hepatitis B, you should be screened for this virus. Talk with your health care provider to find out if you are at risk for hepatitis B infection. Hepatitis C Testing is recommended for: Everyone born from 1945 through 1965. Anyone with known risk factors for hepatitis C. Sexually transmitted infections (STIs) Get screened for STIs, including gonorrhea and chlamydia, if: You are sexually active and are younger than 52 years of age. You are older than 52 years of age and your health care provider tells you that you are at risk for this type of infection. Your sexual activity has changed   since you were last screened, and you are at increased risk for chlamydia or gonorrhea. Ask your health care provider if you are at risk. Ask your health care provider about whether you are at high risk for HIV. Your health care provider may recommend a prescription medicine to help prevent HIV infection. If you choose to take medicine to prevent HIV, you  should first get tested for HIV. You should then be tested every 3 months for as long as you are taking the medicine. Pregnancy If you are about to stop having your period (premenopausal) and you may become pregnant, seek counseling before you get pregnant. Take 400 to 800 micrograms (mcg) of folic acid every day if you become pregnant. Ask for birth control (contraception) if you want to prevent pregnancy. Osteoporosis and menopause Osteoporosis is a disease in which the bones lose minerals and strength with aging. This can result in bone fractures. If you are 65 years old or older, or if you are at risk for osteoporosis and fractures, ask your health care provider if you should: Be screened for bone loss. Take a calcium or vitamin D supplement to lower your risk of fractures. Be given hormone replacement therapy (HRT) to treat symptoms of menopause. Follow these instructions at home: Alcohol use Do not drink alcohol if: Your health care provider tells you not to drink. You are pregnant, may be pregnant, or are planning to become pregnant. If you drink alcohol: Limit how much you have to: 0-1 drink a day. Know how much alcohol is in your drink. In the [place] one drink equals one 12 oz bottle of beer (355 mL), one 5 oz glass of wine (148 mL), or one 1 oz glass of hard liquor (44 mL). Lifestyle Do not use any products that contain nicotine or tobacco. These products include cigarettes, chewing tobacco, and vaping devices, such as e-cigarettes. If you need help quitting, ask your health care provider. Do not use street drugs. Do not share needles. Ask your health care provider for help if you need support or information about quitting drugs. General instructions Schedule regular health, dental, and eye exams. Stay current with your vaccines. Tell your health care provider if: You often feel depressed. You have ever been abused or do not feel safe at home. Summary Adopting a healthy  lifestyle and getting preventive care are important in promoting health and wellness. Follow your health care provider's instructions about healthy diet, exercising, and getting tested or screened for diseases. Follow your health care provider's instructions on monitoring your cholesterol and blood pressure. This information is not intended to replace advice given to you by your health care provider. Make sure you discuss any questions you have with your health care provider. Document Revised: 10/18/2020 Document Reviewed: 10/18/2020 Elsevier Patient Education  2022 [employer] Inc[employer]   

## 2021-07-19 ENCOUNTER — Ambulatory Visit (INDEPENDENT_AMBULATORY_CARE_PROVIDER_SITE_OTHER): Payer: BC Managed Care – PPO | Admitting: Internal Medicine

## 2021-07-19 ENCOUNTER — Other Ambulatory Visit: Payer: Self-pay

## 2021-07-19 VITALS — BP 112/72 | HR 66 | Temp 97.9°F | Ht 68.0 in | Wt 192.0 lb

## 2021-07-19 DIAGNOSIS — J4520 Mild intermittent asthma, uncomplicated: Secondary | ICD-10-CM | POA: Diagnosis not present

## 2021-07-19 DIAGNOSIS — Z0000 Encounter for general adult medical examination without abnormal findings: Secondary | ICD-10-CM | POA: Diagnosis not present

## 2021-07-19 DIAGNOSIS — R739 Hyperglycemia, unspecified: Secondary | ICD-10-CM

## 2021-07-19 MED ORDER — ALBUTEROL SULFATE HFA 108 (90 BASE) MCG/ACT IN AERS: INHALATION_SPRAY | RESPIRATORY_TRACT | 11 refills | Status: DC

## 2021-07-19 NOTE — Assessment & Plan Note (Signed)
Chronic Intermittent More persistent now due to home pets - cat, dog Continue albuterol inhaler as needed

## 2021-07-19 NOTE — Assessment & Plan Note (Signed)
Chronic Check a1c Low sugar / carb diet Stressed regular exercise  

## 2021-07-22 ENCOUNTER — Other Ambulatory Visit (INDEPENDENT_AMBULATORY_CARE_PROVIDER_SITE_OTHER): Payer: BC Managed Care – PPO

## 2021-07-22 DIAGNOSIS — Z0000 Encounter for general adult medical examination without abnormal findings: Secondary | ICD-10-CM | POA: Diagnosis not present

## 2021-07-22 DIAGNOSIS — R739 Hyperglycemia, unspecified: Secondary | ICD-10-CM | POA: Diagnosis not present

## 2021-07-22 LAB — COMPREHENSIVE METABOLIC PANEL
ALT: 12 U/L (ref 0–35)
AST: 14 U/L (ref 0–37)
Albumin: 4 g/dL (ref 3.5–5.2)
Alkaline Phosphatase: 39 U/L (ref 39–117)
BUN: 13 mg/dL (ref 6–23)
CO2: 31 mEq/L (ref 19–32)
Calcium: 9.1 mg/dL (ref 8.4–10.5)
Chloride: 102 mEq/L (ref 96–112)
Creatinine, Ser: 0.93 mg/dL (ref 0.40–1.20)
GFR: 70.99 mL/min (ref >60.00)
Glucose, Bld: 92 mg/dL (ref 70–99)
Potassium: 4 mEq/L (ref 3.5–5.1)
Sodium: 139 mEq/L (ref 135–145)
Total Bilirubin: 1.4 mg/dL — ABNORMAL HIGH (ref 0.2–1.2)
Total Protein: 7.4 g/dL (ref 6.0–8.3)

## 2021-07-22 LAB — CBC WITH DIFFERENTIAL/PLATELET
Basophils Absolute: 0.1 10*3/uL (ref 0.0–0.1)
Basophils Relative: 1.3 % (ref 0.0–3.0)
Eosinophils Absolute: 0.2 10*3/uL (ref 0.0–0.7)
Eosinophils Relative: 2.9 % (ref 0.0–5.0)
HCT: 41.7 % (ref 36.0–46.0)
Hemoglobin: 14.1 g/dL (ref 12.0–15.0)
Lymphocytes Relative: 29.6 % (ref 12.0–46.0)
Lymphs Abs: 2 10*3/uL (ref 0.7–4.0)
MCHC: 33.7 g/dL (ref 30.0–36.0)
MCV: 92.2 fl (ref 78.0–100.0)
Monocytes Absolute: 0.4 10*3/uL (ref 0.1–1.0)
Monocytes Relative: 6.7 % (ref 3.0–12.0)
Neutro Abs: 4 10*3/uL (ref 1.4–7.7)
Neutrophils Relative %: 59.5 % (ref 43.0–77.0)
Platelets: 321 10*3/uL (ref 150.0–400.0)
RBC: 4.53 Mil/uL (ref 3.87–5.11)
RDW: 13.2 % (ref 11.5–15.5)
WBC: 6.7 10*3/uL (ref 4.0–10.5)

## 2021-07-22 LAB — LIPID PANEL
Cholesterol: 211 mg/dL — ABNORMAL HIGH (ref 0–200)
HDL: 58.9 mg/dL (ref >39.00)
LDL Cholesterol: 140 mg/dL — ABNORMAL HIGH (ref 0–99)
NonHDL: 152.51
Total CHOL/HDL Ratio: 4
Triglycerides: 62 mg/dL (ref 0.0–149.0)
VLDL: 12.4 mg/dL (ref 0.0–40.0)

## 2021-07-22 LAB — TSH: TSH: 2.77 u[IU]/mL (ref 0.35–5.50)

## 2021-07-22 LAB — HEMOGLOBIN A1C: Hgb A1c MFr Bld: 5.5 % (ref 4.6–6.5)

## 2021-07-24 ENCOUNTER — Encounter: Payer: Self-pay | Admitting: Internal Medicine

## 2021-07-24 DIAGNOSIS — E782 Mixed hyperlipidemia: Secondary | ICD-10-CM

## 2021-07-25 DIAGNOSIS — E785 Hyperlipidemia, unspecified: Secondary | ICD-10-CM | POA: Insufficient documentation

## 2021-07-28 DIAGNOSIS — Z01419 Encounter for gynecological examination (general) (routine) without abnormal findings: Secondary | ICD-10-CM | POA: Diagnosis not present

## 2021-07-28 DIAGNOSIS — Z1231 Encounter for screening mammogram for malignant neoplasm of breast: Secondary | ICD-10-CM | POA: Diagnosis not present

## 2021-07-28 DIAGNOSIS — Z6829 Body mass index (BMI) 29.0-29.9, adult: Secondary | ICD-10-CM | POA: Diagnosis not present

## 2021-07-29 DIAGNOSIS — Z124 Encounter for screening for malignant neoplasm of cervix: Secondary | ICD-10-CM | POA: Diagnosis not present

## 2021-07-29 DIAGNOSIS — Z1151 Encounter for screening for human papillomavirus (HPV): Secondary | ICD-10-CM | POA: Diagnosis not present

## 2021-08-01 ENCOUNTER — Other Ambulatory Visit: Payer: Self-pay | Admitting: Obstetrics & Gynecology

## 2021-08-01 DIAGNOSIS — R928 Other abnormal and inconclusive findings on diagnostic imaging of breast: Secondary | ICD-10-CM

## 2021-08-19 ENCOUNTER — Ambulatory Visit
Admission: RE | Admit: 2021-08-19 | Discharge: 2021-08-19 | Disposition: A | Discharge status: Home / Self Care | Payer: BC Managed Care – PPO | Source: Ambulatory Visit | Attending: Obstetrics & Gynecology | Admitting: Obstetrics & Gynecology

## 2021-08-19 ENCOUNTER — Other Ambulatory Visit: Payer: Self-pay

## 2021-08-19 DIAGNOSIS — R928 Other abnormal and inconclusive findings on diagnostic imaging of breast: Secondary | ICD-10-CM

## 2021-08-19 DIAGNOSIS — R922 Inconclusive mammogram: Secondary | ICD-10-CM | POA: Diagnosis not present

## 2021-12-03 ENCOUNTER — Other Ambulatory Visit: Payer: Self-pay | Admitting: Internal Medicine

## 2022-10-29 IMAGING — MG DIGITAL DIAGNOSTIC BILAT W/ TOMO W/ CAD
8 series · 8 of 24 positions shown · non-contrast
Comparison: Previous exam(s).

CLINICAL DATA: Possible mass in the posterior upper inner right
breast and possible asymmetry in the posterior outer left breast in
the craniocaudal projection of a recent screening mammogram.



[L CC synth-2D]
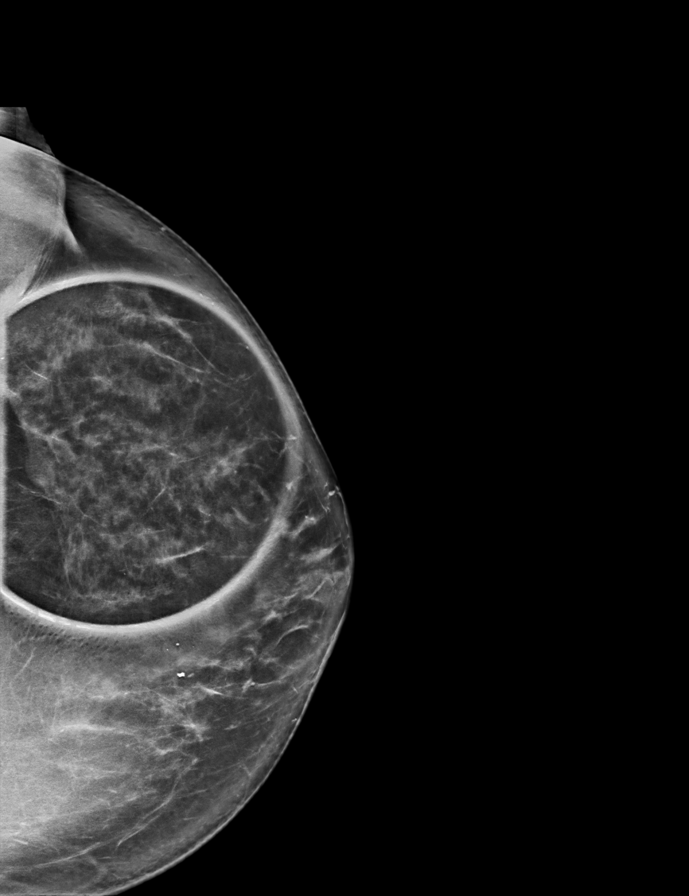

[R CC synth-2D]
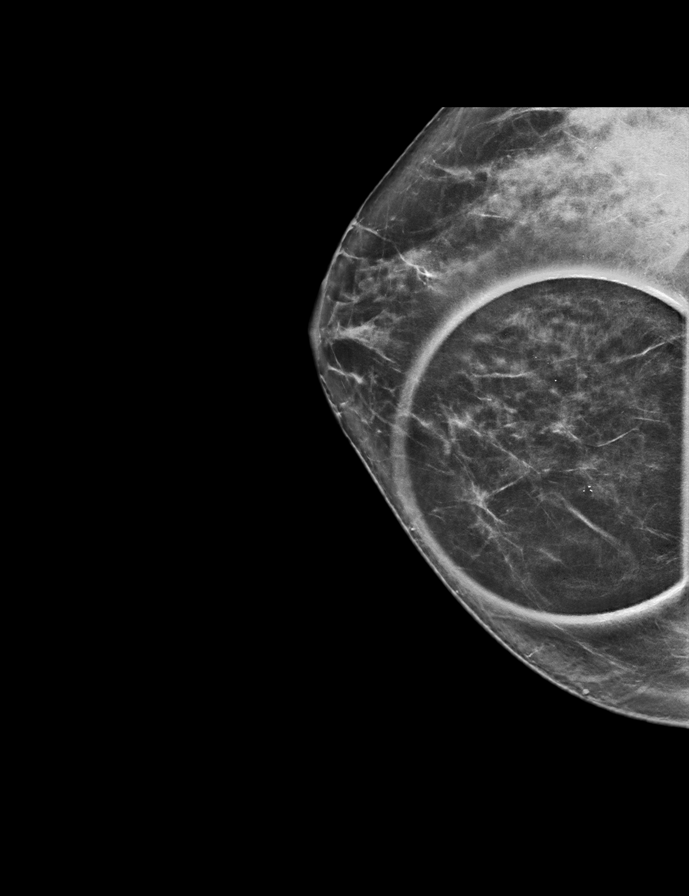

[R MLO synth-2D]
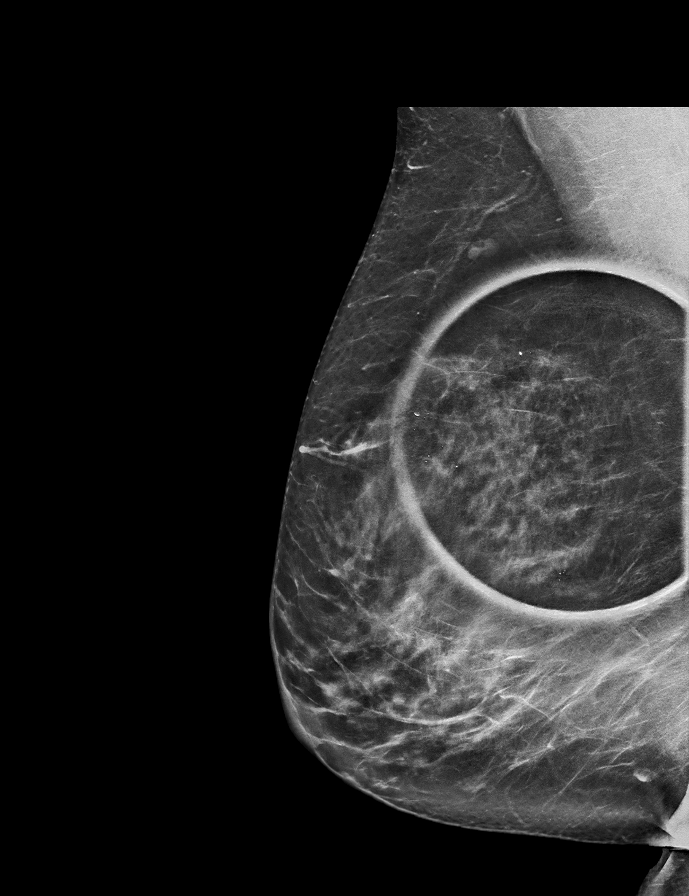

[L ML synth-2D]
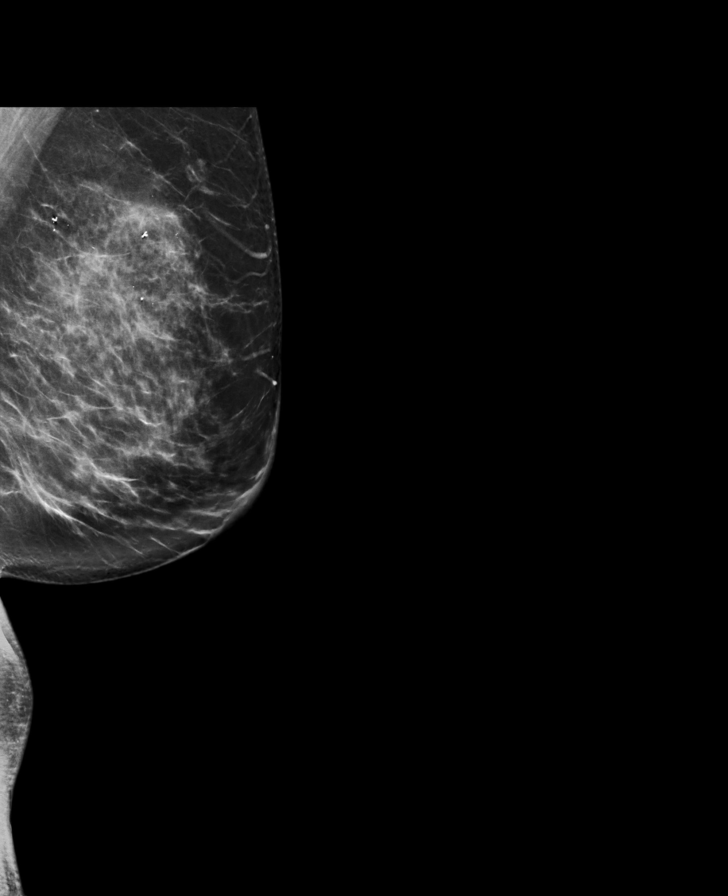

[L CC tomo · tomo slice 42/83.0]
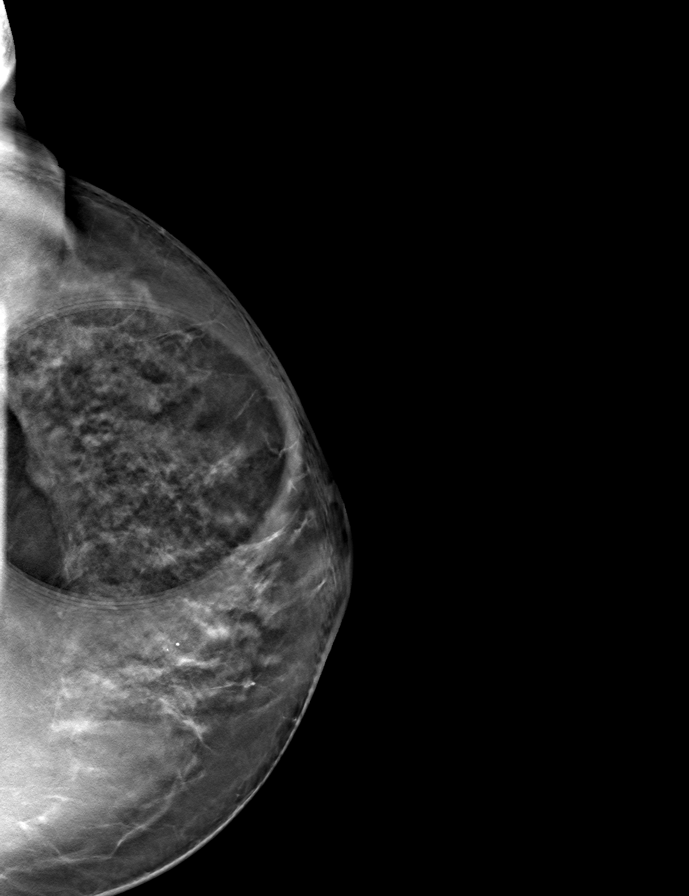

[R CC tomo · tomo slice 37/73.0]
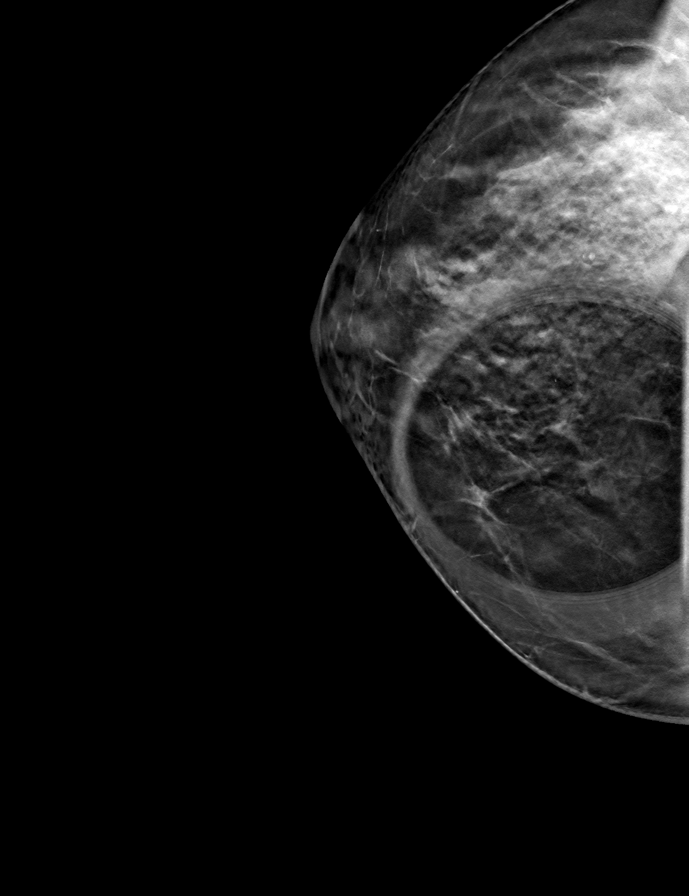

[R MLO tomo · tomo slice 39/78.0]
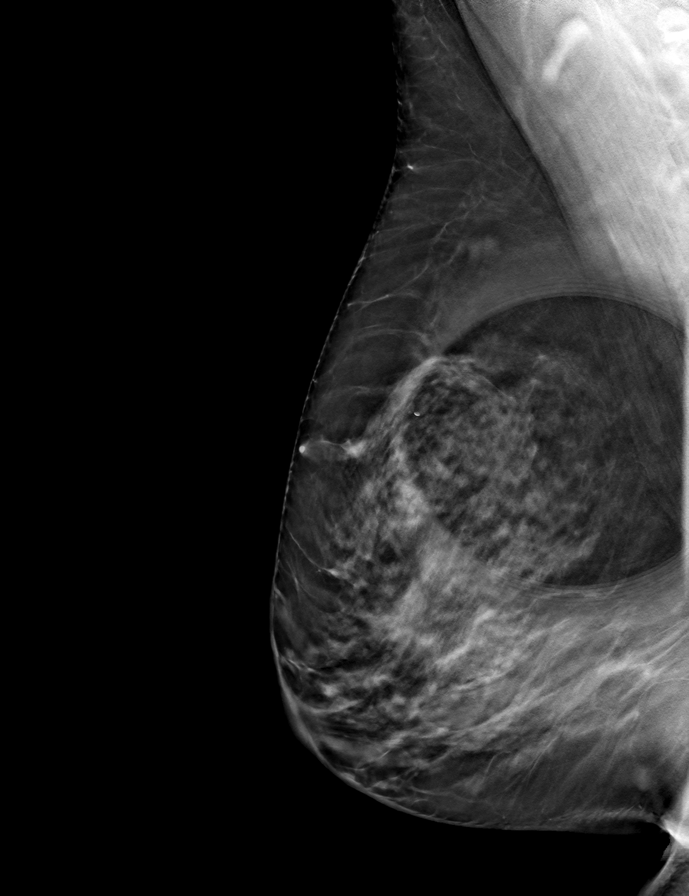

[L ML tomo · tomo slice 45/90.0]
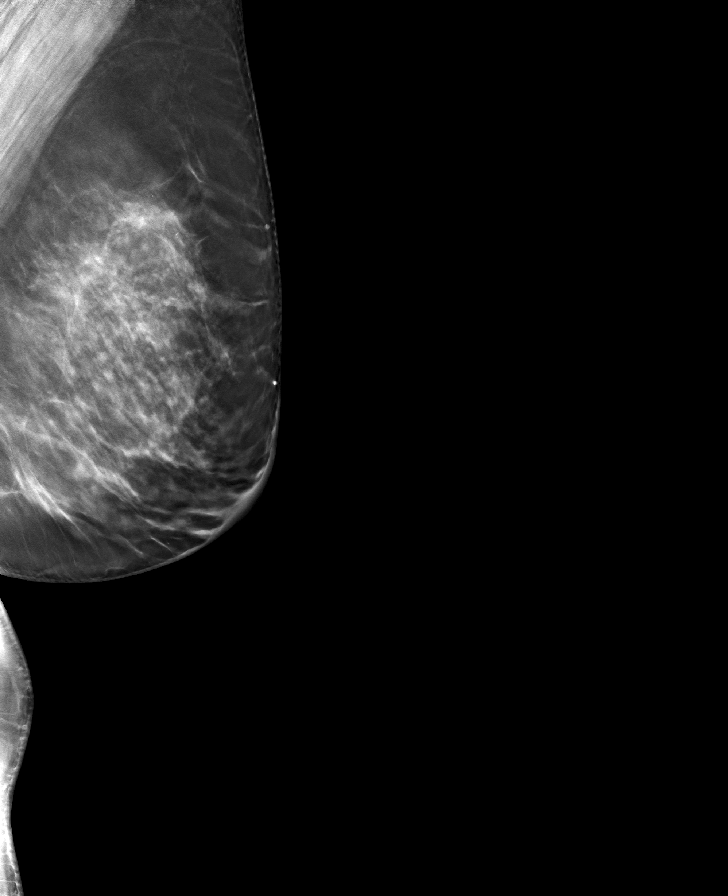

[8 of 24 positions shown; findings below may reference images not displayed]

ACR Breast Density Category c: The breast tissue is heterogeneously
dense, which may obscure small masses.
FINDINGS: 3D tomographic and 2D generated spot compression images of the right
breast demonstrate normal appearing fibroglandular tissue in the
posterior upper breast at the location of the recently suspected
asymmetry in the oblique projection. There is a questionable
persistent oval asymmetry or nodular fibroglandular tissue in the
posterior medial aspect of the breast.

An oval mass-like density is demonstrated in the posterior upper
outer left breast in the craniocaudal projection at the posterior
margin of the fibroglandular tissue. This is partially circumscribed
and partially obscured. This is not seen in the true lateral or
oblique projection.

Targeted ultrasound is performed, showing a 9 mm cyst with low-level
internal echoes in the 1:30 o'clock position of the right breast, 6
cm from the nipple. This has no internal blood flow with power
Doppler and corresponds to the oval asymmetry seen mammographically.

A 1.0 cm oval cyst containing low-level internal echoes is
demonstrated in the 1:30 o'clock position of the left breast
posteriorly. This corresponds to the oval mass-like density seen
mammographically.
IMPRESSION: 1. Bilateral benign breast cysts.
2. No evidence of malignancy in either breast.

RECOMMENDATION:
Bilateral screening mammogram in July 2022 when due.

I have discussed the findings and recommendations with the patient.
If applicable, a reminder letter will be sent to the patient
regarding the next appointment.

BI-RADS CATEGORY  2: Benign.

## 2022-10-29 IMAGING — US US BREAST*R* LIMITED INC AXILLA
1 series · 8 of 8 positions shown · non-contrast
Comparison: Previous exam(s).

CLINICAL DATA: Possible mass in the posterior upper inner right
breast and possible asymmetry in the posterior outer left breast in
the craniocaudal projection of a recent screening mammogram.



[Series 1: us breast*right* limited inc axilla · 0.06mm/px · 8 of 8 slices shown]
[im 1/8]
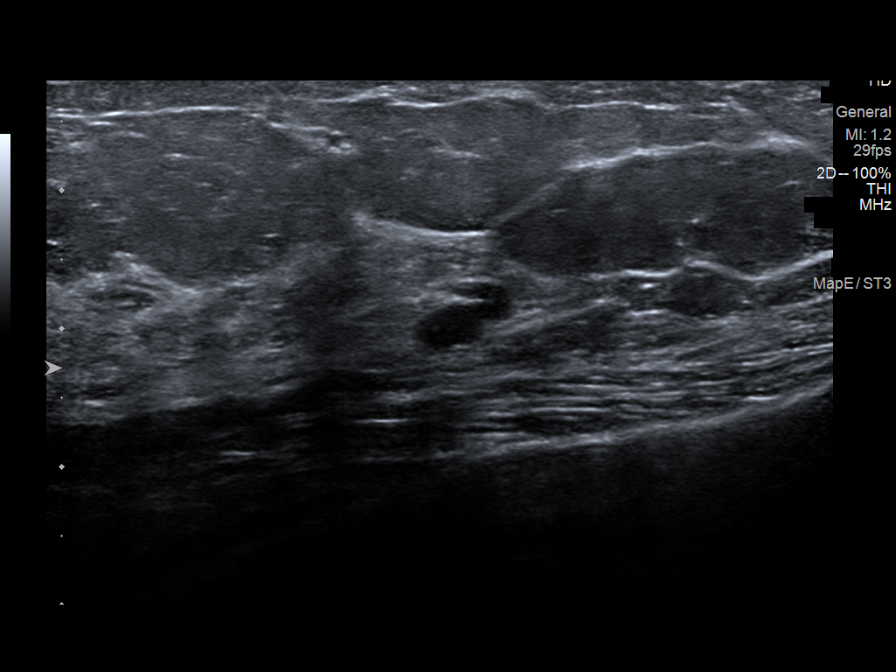
[im 2/8]
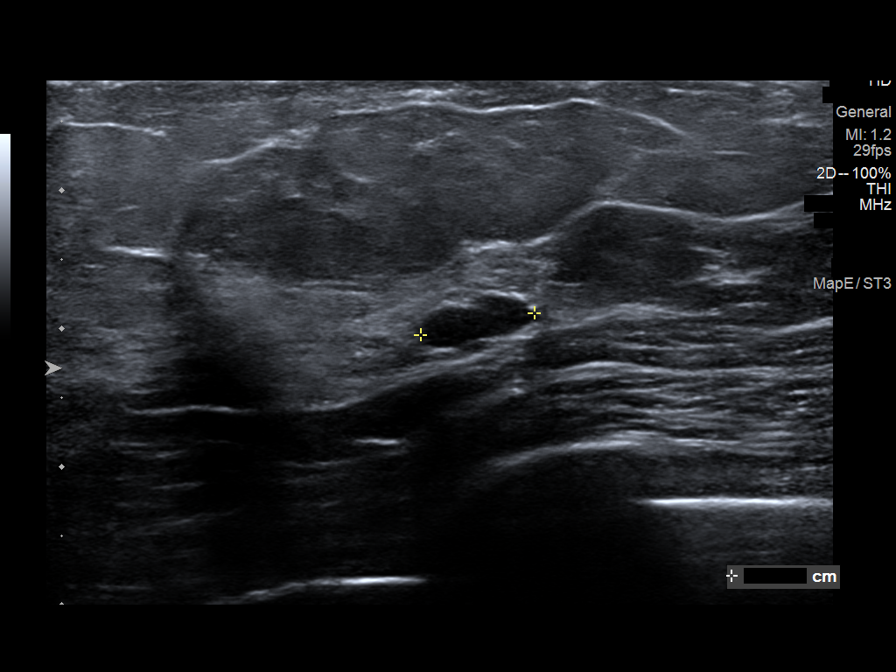
[im 3/8]
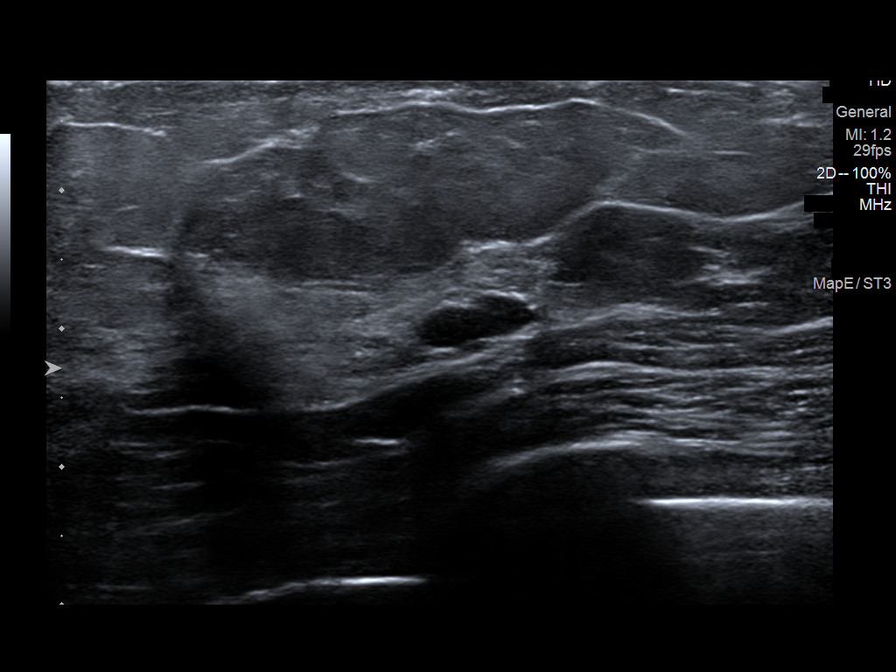
[im 4/8]
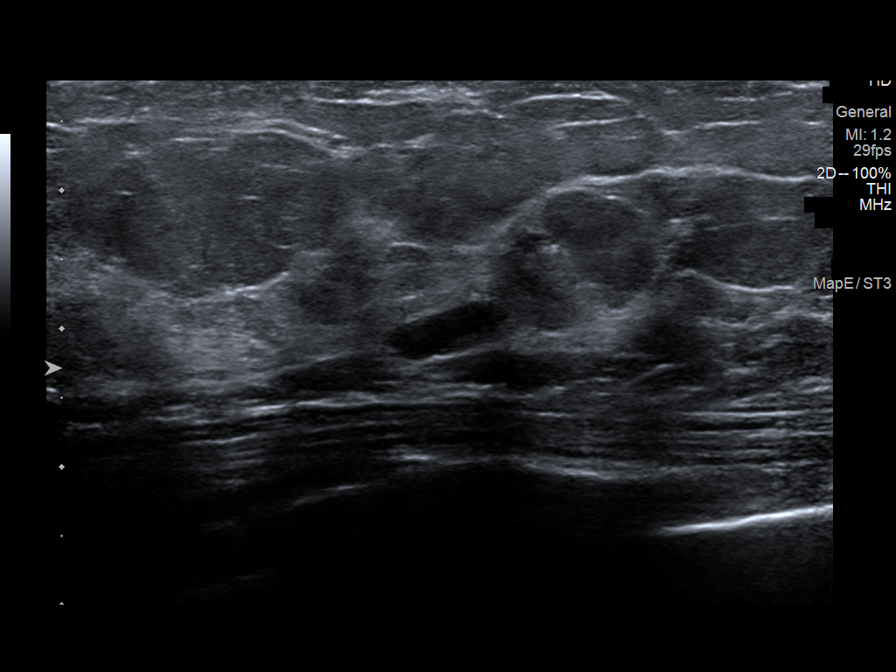
[im 5/8]
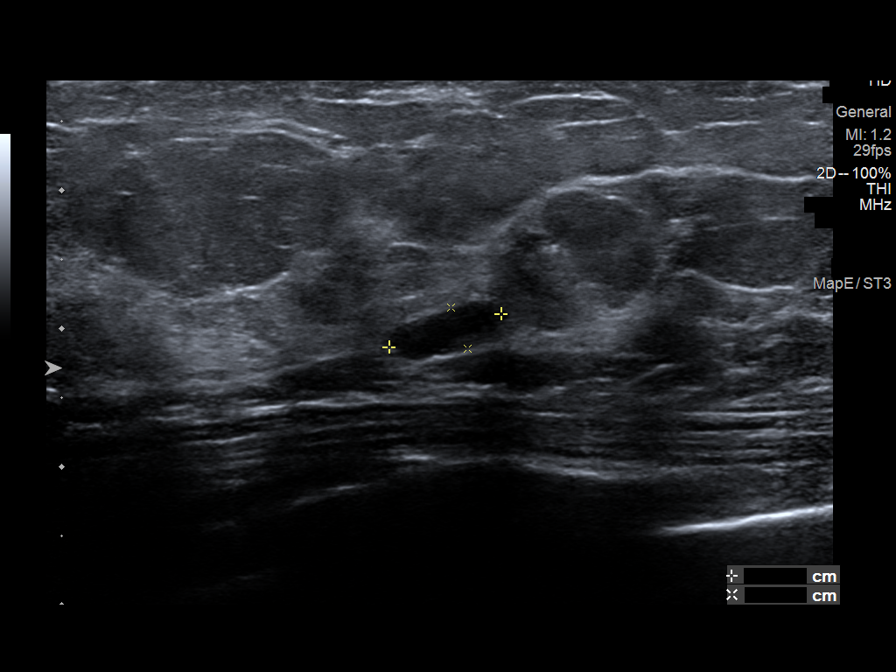
[im 6/8]
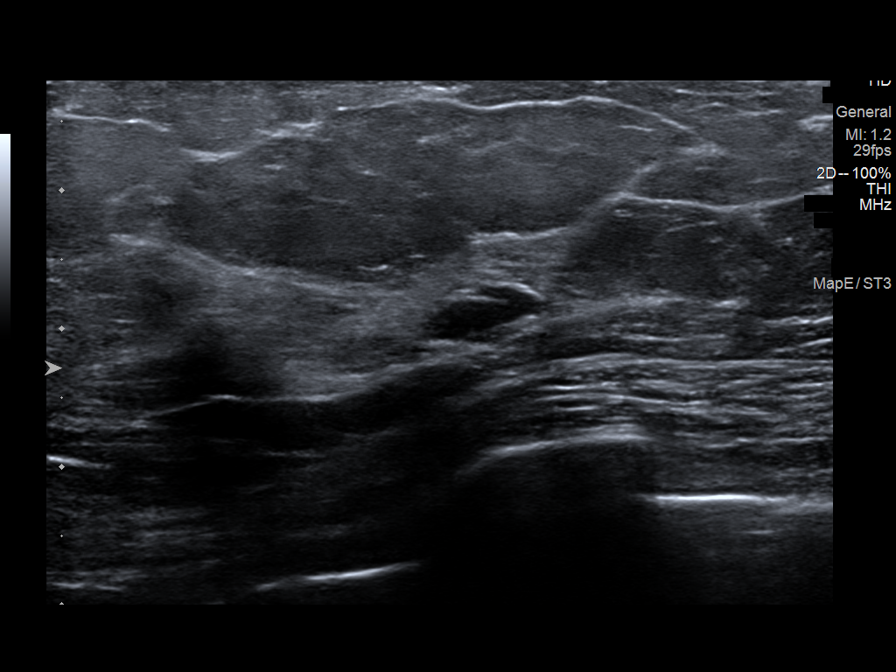
[im 7/8]
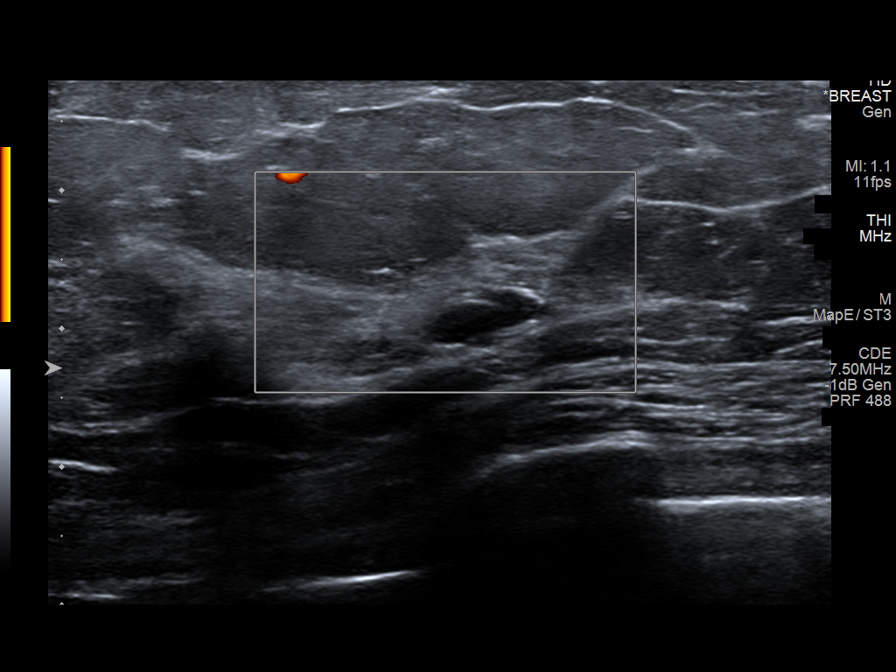
[im 8/8]
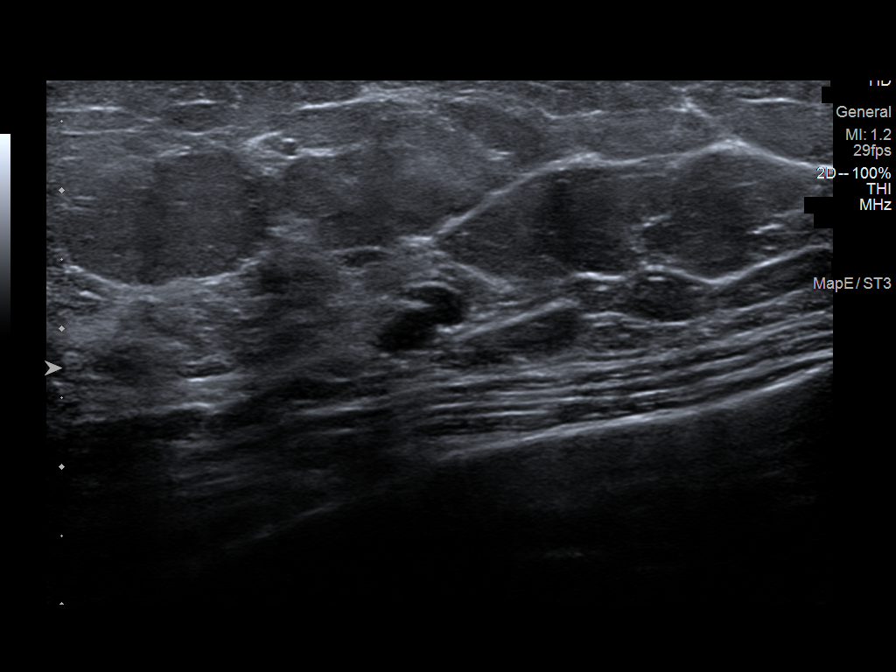

[8 of 8 positions shown; findings below may reference images not displayed]

ACR Breast Density Category c: The breast tissue is heterogeneously
dense, which may obscure small masses.
FINDINGS: 3D tomographic and 2D generated spot compression images of the right
breast demonstrate normal appearing fibroglandular tissue in the
posterior upper breast at the location of the recently suspected
asymmetry in the oblique projection. There is a questionable
persistent oval asymmetry or nodular fibroglandular tissue in the
posterior medial aspect of the breast.

An oval mass-like density is demonstrated in the posterior upper
outer left breast in the craniocaudal projection at the posterior
margin of the fibroglandular tissue. This is partially circumscribed
and partially obscured. This is not seen in the true lateral or
oblique projection.

Targeted ultrasound is performed, showing a 9 mm cyst with low-level
internal echoes in the 1:30 o'clock position of the right breast, 6
cm from the nipple. This has no internal blood flow with power
Doppler and corresponds to the oval asymmetry seen mammographically.

A 1.0 cm oval cyst containing low-level internal echoes is
demonstrated in the 1:30 o'clock position of the left breast
posteriorly. This corresponds to the oval mass-like density seen
mammographically.
IMPRESSION: 1. Bilateral benign breast cysts.
2. No evidence of malignancy in either breast.

RECOMMENDATION:
Bilateral screening mammogram in July 2022 when due.

I have discussed the findings and recommendations with the patient.
If applicable, a reminder letter will be sent to the patient
regarding the next appointment.

BI-RADS CATEGORY  2: Benign.

## 2022-11-27 ENCOUNTER — Encounter: Payer: Self-pay | Admitting: Internal Medicine

## 2022-11-27 NOTE — Patient Instructions (Addendum)
    Blood work was ordered.   The lab is on the first floor.    Medications changes include :   none     Return in about 1 year (around 11/28/2023) for Physical Exam.    Health Maintenance, Female Adopting a healthy lifestyle and getting preventive care are important in promoting health and wellness. Ask your health care provider about: The right schedule for you to have regular tests and exams. Things you can do on your own to prevent diseases and keep yourself healthy. What should I know about diet, weight, and exercise? Eat a healthy diet  Eat a diet that includes plenty of vegetables, fruits, low-fat dairy products, and lean protein. Do not eat a lot of foods that are high in solid fats, added sugars, or sodium. Maintain a healthy weight Body mass index (BMI) is used to identify weight problems. It estimates body fat based on height and weight. Your health care provider can help determine your BMI and help you achieve or maintain a healthy weight. Get regular exercise Get regular exercise. This is one of the most important things you can do for your health. Most adults should: Exercise for at least 150 minutes each week. The exercise should increase your heart rate and make you sweat (moderate-intensity exercise). Do strengthening exercises at least twice a week. This is in addition to the moderate-intensity exercise. Spend less time sitting. Even light physical activity can be beneficial. Watch cholesterol and blood lipids Have your blood tested for lipids and cholesterol at 52 years of age, then have this test every 5 years. Have your cholesterol levels checked more often if: Your lipid or cholesterol levels are high. You are older than 53 years of age. You are at high risk for heart disease. What should I know about cancer screening? Depending on your health history and family history, you may need to have cancer screening at various ages. This may include screening  for: Breast cancer. Cervical cancer. Colorectal cancer. Skin cancer. Lung cancer. What should I know about heart disease, diabetes, and high blood pressure? Blood pressure and heart disease High blood pressure causes heart disease and increases the risk of stroke. This is more likely to develop in people who have high blood pressure readings or are overweight. Have your blood pressure checked: Every 3-5 years if you are 18-39 years of age. Every year if you are 40 years old or older. Diabetes Have regular diabetes screenings. This checks your fasting blood sugar level. Have the screening done: Once every three years after age 40 if you are at a normal weight and have a low risk for diabetes. More often and at a younger age if you are overweight or have a high risk for diabetes. What should I know about preventing infection? Hepatitis B If you have a higher risk for hepatitis B, you should be screened for this virus. Talk with your health care provider to find out if you are at risk for hepatitis B infection. Hepatitis C Testing is recommended for: Everyone born from 1945 through 1965. Anyone with known risk factors for hepatitis C. Sexually transmitted infections (STIs) Get screened for STIs, including gonorrhea and chlamydia, if: You are sexually active and are younger than 53 years of age. You are older than 53 years of age and your health care provider tells you that you are at risk for this type of infection. Your sexual activity has changed since you were last screened, and you are   at increased risk for chlamydia or gonorrhea. Ask your health care provider if you are at risk. Ask your health care provider about whether you are at high risk for HIV. Your health care provider may recommend a prescription medicine to help prevent HIV infection. If you choose to take medicine to prevent HIV, you should first get tested for HIV. You should then be tested every 3 months for as long as you  are taking the medicine. Pregnancy If you are about to stop having your period (premenopausal) and you may become pregnant, seek counseling before you get pregnant. Take 400 to 800 micrograms (mcg) of folic acid every day if you become pregnant. Ask for birth control (contraception) if you want to prevent pregnancy. Osteoporosis and menopause Osteoporosis is a disease in which the bones lose minerals and strength with aging. This can result in bone fractures. If you are 65 years old or older, or if you are at risk for osteoporosis and fractures, ask your health care provider if you should: Be screened for bone loss. Take a calcium or vitamin D supplement to lower your risk of fractures. Be given hormone replacement therapy (HRT) to treat symptoms of menopause. Follow these instructions at home: Alcohol use Do not drink alcohol if: Your health care provider tells you not to drink. You are pregnant, may be pregnant, or are planning to become pregnant. If you drink alcohol: Limit how much you have to: 0-1 drink a day. Know how much alcohol is in your drink. In the [place] one drink equals one 12 oz bottle of beer (355 mL), one 5 oz glass of wine (148 mL), or one 1 oz glass of hard liquor (44 mL). Lifestyle Do not use any products that contain nicotine or tobacco. These products include cigarettes, chewing tobacco, and vaping devices, such as e-cigarettes. If you need help quitting, ask your health care provider. Do not use street drugs. Do not share needles. Ask your health care provider for help if you need support or information about quitting drugs. General instructions Schedule regular health, dental, and eye exams. Stay current with your vaccines. Tell your health care provider if: You often feel depressed. You have ever been abused or do not feel safe at home. Summary Adopting a healthy lifestyle and getting preventive care are important in promoting health and wellness. Follow your  health care provider's instructions about healthy diet, exercising, and getting tested or screened for diseases. Follow your health care provider's instructions on monitoring your cholesterol and blood pressure. This information is not intended to replace advice given to you by your health care provider. Make sure you discuss any questions you have with your health care provider. Document Revised: 10/18/2020 Document Reviewed: 10/18/2020 Elsevier Patient Education  2024 [employer].  

## 2022-11-27 NOTE — Progress Notes (Unsigned)
Subjective:    Patient ID: Kristina Booth, female    DOB: 05-Jun-1971, 52 y.o.   MRN: [id number]      HPI Kristina Booth is here for a Physical exam and her chronic medical problems.    No changes in health.  Doing well and has no concerns.  Medications and allergies reviewed with patient and updated if appropriate.  Current Outpatient Medications on File Prior to Visit  Medication Sig Dispense Refill   albuterol (VENTOLIN HFA) 108 (90 Base) MCG/ACT inhaler INHALE 1-2 PUFFS BY MOUTH EVERY 6 HOURS AS NEEDED FOR WHEEZE OR SHORTNESS OF BREATH 6.7 each 8   Calcium Carb-Cholecalciferol (CALCIUM 1000 + D PO) Take by mouth.     Multiple Vitamin (MULTIVITAMIN) capsule Take 1 capsule by mouth daily.     Omega-3 Fatty Acids (FISH OIL PO) Take by mouth.     No current facility-administered medications on file prior to visit.    Review of Systems  Constitutional:  Negative for fever.  Eyes:  Negative for visual disturbance.  Respiratory:  Negative for cough, shortness of breath and wheezing.   Cardiovascular:  Negative for chest pain, palpitations and leg swelling.  Gastrointestinal:  Negative for abdominal pain, blood in stool, constipation and diarrhea.       No gerd  Genitourinary:  Negative for dysuria.  Musculoskeletal:  Negative for arthralgias and back pain.  Skin:  Negative for rash.  Neurological:  Negative for light-headedness and headaches.  Psychiatric/Behavioral:  Negative for dysphoric mood. The patient is not nervous/anxious.        Objective:   Vitals:   11/28/22 1539  BP: 102/72  Pulse: 74  Temp: 97.8 F (36.6 C)  SpO2: 98%   Filed Weights   11/28/22 1539  Weight: 186 lb (84.4 kg)   Body mass index is 28.28 kg/m.  BP Readings from Last 3 Encounters:  11/28/22 102/72  07/19/21 112/72  07/23/20 120/87    Wt Readings from Last 3 Encounters:  11/28/22 186 lb (84.4 kg)  07/19/21 192 lb (87.1 kg)  07/23/20 190 lb (86.2 kg)       Physical  Exam Constitutional: She appears well-developed and well-nourished. No distress.  HENT:  Head: Normocephalic and atraumatic.  Right Ear: External ear normal. Normal ear canal and TM Left Ear: External ear normal.  Normal ear canal and TM Mouth/Throat: Oropharynx is clear and moist.  Eyes: Conjunctivae normal.  Neck: Neck supple. No tracheal deviation present. No thyromegaly present.  No carotid bruit  Cardiovascular: Normal rate, regular rhythm and normal heart sounds.   No murmur heard.  No edema. Pulmonary/Chest: Effort normal and breath sounds normal. No respiratory distress. She has no wheezes. She has no rales.  Breast: deferred   Abdominal: Soft. She exhibits no distension. There is no tenderness.  Lymphadenopathy: She has no cervical adenopathy.  Skin: Skin is warm and dry. She is not diaphoretic.  Psychiatric: She has a normal mood and affect. Her behavior is normal.     Lab Results  Component Value Date   WBC 6.7 07/22/2021   HGB 14.1 07/22/2021   HCT 41.7 07/22/2021   PLT 321.0 07/22/2021   GLUCOSE 92 07/22/2021   CHOL 211 (H) 07/22/2021   TRIG 62.0 07/22/2021   HDL 58.90 07/22/2021   LDLCALC 140 (H) 07/22/2021   ALT 12 07/22/2021   AST 14 07/22/2021   NA 139 07/22/2021   K 4.0 07/22/2021   CL 102 07/22/2021   CREATININE 0.93 07/22/2021  BUN 13 07/22/2021   CO2 31 07/22/2021   TSH 2.77 07/22/2021   HGBA1C 5.5 07/22/2021         Assessment & Plan:   Physical exam: Screening blood work  ordered Exercise couple times a week-knows she should do more Weight is okay Substance abuse  none   Reviewed recommended immunizations.   Health Maintenance  Topic Date Due   PAP SMEAR-Modifier  06/27/2018   COVID-19 Vaccine (5 - 2023-24 season) 02/10/2022   Hepatitis C Screening  05/14/2065 (Originally [birth year]/10/1987)   INFLUENZA VACCINE  01/11/2023   MAMMOGRAM  08/20/2023   DTaP/Tdap/Td (3 - Td or Tdap) 05/14/2030   Colonoscopy  07/23/2030   HIV Screening   Completed   Zoster Vaccines- Shingrix  Completed   HPV VACCINES  Aged Out          See Problem List for Assessment and Plan of chronic medical problems.

## 2022-11-28 ENCOUNTER — Ambulatory Visit (INDEPENDENT_AMBULATORY_CARE_PROVIDER_SITE_OTHER): Payer: BC Managed Care – PPO | Admitting: Internal Medicine

## 2022-11-28 VITALS — BP 102/72 | HR 74 | Temp 97.8°F | Ht 68.0 in | Wt 186.0 lb

## 2022-11-28 DIAGNOSIS — Z0000 Encounter for general adult medical examination without abnormal findings: Secondary | ICD-10-CM | POA: Diagnosis not present

## 2022-11-28 DIAGNOSIS — R739 Hyperglycemia, unspecified: Secondary | ICD-10-CM | POA: Diagnosis not present

## 2022-11-28 DIAGNOSIS — J4520 Mild intermittent asthma, uncomplicated: Secondary | ICD-10-CM | POA: Diagnosis not present

## 2022-11-28 DIAGNOSIS — E782 Mixed hyperlipidemia: Secondary | ICD-10-CM

## 2022-11-28 LAB — LIPID PANEL
Cholesterol: 235 mg/dL — ABNORMAL HIGH (ref 0–200)
HDL: 57.4 mg/dL (ref >39.00)
LDL Cholesterol: 162 mg/dL — ABNORMAL HIGH (ref 0–99)
NonHDL: 178.08
Total CHOL/HDL Ratio: 4
Triglycerides: 79 mg/dL (ref 0.0–149.0)
VLDL: 15.8 mg/dL (ref 0.0–40.0)

## 2022-11-28 LAB — CBC WITH DIFFERENTIAL/PLATELET
Basophils Absolute: 0.1 10*3/uL (ref 0.0–0.1)
Basophils Relative: 1.3 % (ref 0.0–3.0)
Eosinophils Absolute: 0.1 10*3/uL (ref 0.0–0.7)
Eosinophils Relative: 1.2 % (ref 0.0–5.0)
HCT: 41.9 % (ref 36.0–46.0)
Hemoglobin: 14.1 g/dL (ref 12.0–15.0)
Lymphocytes Relative: 30 % (ref 12.0–46.0)
Lymphs Abs: 2.1 10*3/uL (ref 0.7–4.0)
MCHC: 33.6 g/dL (ref 30.0–36.0)
MCV: 92.2 fl (ref 78.0–100.0)
Monocytes Absolute: 0.4 10*3/uL (ref 0.1–1.0)
Monocytes Relative: 5.8 % (ref 3.0–12.0)
Neutro Abs: 4.3 10*3/uL (ref 1.4–7.7)
Neutrophils Relative %: 61.7 % (ref 43.0–77.0)
Platelets: 353 10*3/uL (ref 150.0–400.0)
RBC: 4.54 Mil/uL (ref 3.87–5.11)
RDW: 13 % (ref 11.5–15.5)
WBC: 7 10*3/uL (ref 4.0–10.5)

## 2022-11-28 LAB — COMPREHENSIVE METABOLIC PANEL
ALT: 11 U/L (ref 0–35)
AST: 15 U/L (ref 0–37)
Albumin: 4.6 g/dL (ref 3.5–5.2)
Alkaline Phosphatase: 37 U/L — ABNORMAL LOW (ref 39–117)
BUN: 10 mg/dL (ref 6–23)
CO2: 27 mEq/L (ref 19–32)
Calcium: 9.4 mg/dL (ref 8.4–10.5)
Chloride: 102 mEq/L (ref 96–112)
Creatinine, Ser: 0.97 mg/dL (ref 0.40–1.20)
GFR: 66.85 mL/min (ref >60.00)
Glucose, Bld: 84 mg/dL (ref 70–99)
Potassium: 4 mEq/L (ref 3.5–5.1)
Sodium: 136 mEq/L (ref 135–145)
Total Bilirubin: 1.4 mg/dL — ABNORMAL HIGH (ref 0.2–1.2)
Total Protein: 8.2 g/dL (ref 6.0–8.3)

## 2022-11-28 LAB — HEMOGLOBIN A1C: Hgb A1c MFr Bld: 5.5 % (ref 4.6–6.5)

## 2022-11-28 LAB — TSH: TSH: 1.89 u[IU]/mL (ref 0.35–5.50)

## 2022-11-28 NOTE — Assessment & Plan Note (Signed)
Chronic °Intermittent °More persistent now due to home pets - cat, dog °Continue albuterol inhaler as needed °

## 2022-11-28 NOTE — Assessment & Plan Note (Signed)
Chronic Regular exercise and healthy diet encouraged Check lipid panel, CMP, CBC, TSH Continue lifestyle control 

## 2022-11-28 NOTE — Assessment & Plan Note (Signed)
Chronic Check a1c Low sugar / carb diet Stressed regular exercise  

## 2022-11-30 ENCOUNTER — Encounter: Payer: Self-pay | Admitting: Internal Medicine

## 2022-11-30 DIAGNOSIS — Z1231 Encounter for screening mammogram for malignant neoplasm of breast: Secondary | ICD-10-CM

## 2022-11-30 MED ORDER — ALBUTEROL SULFATE HFA 108 (90 BASE) MCG/ACT IN AERS: INHALATION_SPRAY | RESPIRATORY_TRACT | 11 refills | Status: AC

## 2022-12-22 ENCOUNTER — Ambulatory Visit
Admission: RE | Admit: 2022-12-22 | Discharge: 2022-12-22 | Disposition: A | Discharge status: Home / Self Care | Payer: BC Managed Care – PPO | Source: Ambulatory Visit | Attending: Internal Medicine | Admitting: Internal Medicine

## 2022-12-22 ENCOUNTER — Ambulatory Visit: Payer: BC Managed Care – PPO

## 2022-12-22 DIAGNOSIS — Z1231 Encounter for screening mammogram for malignant neoplasm of breast: Secondary | ICD-10-CM
